# Patient Record
Sex: Male | Born: 1950 | Race: White | Hispanic: No | Marital: Married | Smoking: Never smoker
Health system: Southern US, Community
[De-identification: ages and names within clinical notes are randomized; demographics above are authoritative.]

## PROBLEM LIST (undated history)

## (undated) DIAGNOSIS — E785 Hyperlipidemia, unspecified: Secondary | ICD-10-CM

## (undated) DIAGNOSIS — R011 Cardiac murmur, unspecified: Secondary | ICD-10-CM

## (undated) DIAGNOSIS — T7840XA Allergy, unspecified, initial encounter: Secondary | ICD-10-CM

## (undated) HISTORY — DX: Cardiac murmur, unspecified: R01.1

## (undated) HISTORY — DX: Allergy, unspecified, initial encounter: T78.40XA

## (undated) HISTORY — PX: HAND SURGERY: SHX662

## (undated) HISTORY — PX: SPINE SURGERY: SHX786

## (undated) HISTORY — PX: COLONOSCOPY: SHX174

## (undated) HISTORY — DX: Hyperlipidemia, unspecified: E78.5

---

## 2001-08-26 ENCOUNTER — Ambulatory Visit (HOSPITAL_COMMUNITY): Admission: RE | Admit: 2001-08-26 | Discharge: 2001-08-26 | Payer: Self-pay | Admitting: Internal Medicine

## 2001-08-26 ENCOUNTER — Encounter: Payer: Self-pay | Admitting: Internal Medicine

## 2003-06-13 ENCOUNTER — Encounter: Admission: RE | Admit: 2003-06-13 | Discharge: 2003-09-11 | Payer: Self-pay | Admitting: Neurology

## 2003-09-25 ENCOUNTER — Emergency Department (HOSPITAL_COMMUNITY): Admission: AD | Admit: 2003-09-25 | Discharge: 2003-09-25 | Payer: Self-pay | Admitting: Family Medicine

## 2005-06-21 ENCOUNTER — Emergency Department (HOSPITAL_COMMUNITY): Admission: EM | Admit: 2005-06-21 | Discharge: 2005-06-21 | Payer: Self-pay | Admitting: Emergency Medicine

## 2007-07-12 ENCOUNTER — Emergency Department (HOSPITAL_COMMUNITY): Admission: EM | Admit: 2007-07-12 | Discharge: 2007-07-12 | Payer: Self-pay | Admitting: Emergency Medicine

## 2008-11-23 ENCOUNTER — Ambulatory Visit (HOSPITAL_BASED_OUTPATIENT_CLINIC_OR_DEPARTMENT_OTHER): Admission: RE | Admit: 2008-11-23 | Discharge: 2008-11-23 | Payer: Self-pay | Admitting: Orthopedic Surgery

## 2008-11-23 ENCOUNTER — Encounter (INDEPENDENT_AMBULATORY_CARE_PROVIDER_SITE_OTHER): Payer: Self-pay | Admitting: Orthopedic Surgery

## 2008-12-31 ENCOUNTER — Encounter (INDEPENDENT_AMBULATORY_CARE_PROVIDER_SITE_OTHER): Payer: Self-pay | Admitting: *Deleted

## 2009-10-17 ENCOUNTER — Telehealth: Payer: Self-pay | Admitting: Gastroenterology

## 2010-09-09 ENCOUNTER — Encounter: Payer: Self-pay | Admitting: Gastroenterology

## 2010-09-09 ENCOUNTER — Ambulatory Visit (HOSPITAL_COMMUNITY)
Admission: RE | Admit: 2010-09-09 | Discharge: 2010-09-09 | Payer: Self-pay | Source: Home / Self Care | Attending: Internal Medicine | Admitting: Internal Medicine

## 2010-09-16 NOTE — Progress Notes (Signed)
Summary: Schedule Colonoscopy  Phone Note Outgoing Call Call back at [facility] Phone [phone] Call back at [phone]   Call placed by: Harlow Mares CMA Duncan Dull),  October 17, 2009 11:38 AM Call placed to: Patient Summary of Call: patient phone number just rings then goes to fax number. Schedule colonoscopy Initial call taken by: Harlow Mares CMA Duncan Dull),  October 17, 2009 11:39 AM  Follow-up for Phone Call        Left message on patients machine to call back.  Follow-up by: Harlow Mares CMA Duncan Dull),  October 24, 2009 3:45 PM

## 2010-09-18 NOTE — Letter (Signed)
Summary: Pre Visit Letter Revised  [facility]  [address] [place], [place] [address]   Phone: [phone]  Fax: [phone]        09/09/2010 MRN: [id number]  [facility] [address] [place], [place]  [address]  [place]             Procedure Date:  10-13-10 10:30am           Recall Colon - Dr Nedra Hai to the Gastroenterology Division at [facility].    You are scheduled to see a nurse for your pre-procedure visit on 09-30-10 at 9am on the 3rd floor at [facility], [address]  We ask that you try to arrive at our office 15 minutes prior to your appointment time to allow for check-in.  Please take a minute to review the attached form.  If you answer "Yes" to one or more of the questions on the first page, we ask that you call the person listed at your earliest opportunity.  If you answer "No" to all of the questions, please complete the rest of the form and bring it to your appointment.    Your nurse visit will consist of discussing your medical and surgical history, your immediate family medical history, and your medications.   If you are unable to list all of your medications on the form, please bring the medication bottles to your appointment and we will list them.  We will need to be aware of both prescribed and over the counter drugs.  We will need to know exact dosage information as well.    Please be prepared to read and sign documents such as consent forms, a financial agreement, and acknowledgement forms.  If necessary, and with your consent, a friend or relative is welcome to sit-in on the nurse visit with you.  Please bring your insurance card so that we may make a copy of it.  If your insurance requires a referral to see a specialist, please bring your referral form from your primary care physician.  No co-pay is required for this nurse visit.     If you cannot keep your appointment, please call [phone] to cancel or  reschedule prior to your appointment date.  This allows [place] the opportunity to schedule an appointment for another patient in need of care.    Thank you for choosing [facility] for your medical needs.  We appreciate the opportunity to care for you.  Please visit [place] at our website  to learn more about our practice.  Sincerely, The Gastroenterology Division

## 2010-09-26 ENCOUNTER — Encounter (INDEPENDENT_AMBULATORY_CARE_PROVIDER_SITE_OTHER): Payer: Self-pay

## 2010-09-30 ENCOUNTER — Encounter: Payer: Self-pay | Admitting: Gastroenterology

## 2010-10-08 NOTE — Miscellaneous (Signed)
Summary: Lec previsit  Clinical Lists Changes  Medications: Added new medication of MOVIPREP 100 GM  SOLR (PEG-KCL-NACL-NASULF-NA ASC-C) As per prep instructions. - Signed Rx of MOVIPREP 100 GM  SOLR (PEG-KCL-NACL-NASULF-NA ASC-C) As per prep instructions.;  #1 x 0;  Signed;  Entered by: Ulis Rias RN;  Authorized by: Louis Meckel MD;  Method used: Electronically to [facility] [address], [place], [place]  [id number], Ph: [phone] or [phone], Fax: [phone] Allergies: Added new allergy or adverse reaction of PENICILLIN Added new allergy or adverse reaction of ASPIRIN Observations: Added new observation of NKA: F (09/30/2010 9:05)    Prescriptions: MOVIPREP 100 GM  SOLR (PEG-KCL-NACL-NASULF-NA ASC-C) As per prep instructions.  #1 x 0   Entered by:   Ulis Rias RN   Authorized by:   Louis Meckel MD   Signed by:   Ulis Rias RN on 09/30/2010   Method used:   Electronically to        [facility] (retail)       [address]       [place], [place]  [id number]       Ph: [phone] or [phone]       Fax: [phone]   RxID:   [id number]

## 2010-10-08 NOTE — Letter (Signed)
Summary: [facility] Instructions  [facility]  [address] [place], [place] [address]   Phone: [phone]  Fax: [phone]       Randy Reese    Dec 31, 1958    MRN: [id number]        Procedure Day /Date:  Monday 10/13/2010     Arrival Time: 9:30 am     Procedure Time: 10:30 am     Location of Procedure:                    _x _  [facility] (4th Floor)                        PREPARATION FOR COLONOSCOPY WITH MOVIPREP   Starting 5 days prior to your procedure Wednesday 2/22 do not eat nuts, seeds, popcorn, corn, beans, peas,  salads, or any raw vegetables.  Do not take any fiber supplements (e.g. Metamucil, Citrucel, and Benefiber).  THE DAY BEFORE YOUR PROCEDURE         DATE: Sunday 2/26  1.  Drink clear liquids the entire day-NO SOLID FOOD  2.  Do not drink anything colored red or purple.  Avoid juices with pulp.  No orange juice.  3.  Drink at least 64 oz. (8 glasses) of fluid/clear liquids during the day to prevent dehydration and help the prep work efficiently.  CLEAR LIQUIDS INCLUDE: Water Jello Ice Popsicles Tea (sugar ok, no milk/cream) Powdered fruit flavored drinks Coffee (sugar ok, no milk/cream) Gatorade Juice: apple, white grape, white cranberry  Lemonade Clear bullion, consomm, broth Carbonated beverages (any kind) Strained chicken noodle soup Hard Candy                             4.  In the morning, mix first dose of MoviPrep solution:    Empty 1 Pouch A and 1 Pouch B into the disposable container    Add lukewarm drinking water to the top line of the container. Mix to dissolve    Refrigerate (mixed solution should be used within 24 hrs)  5.  Begin drinking the prep at 5:00 p.m. The MoviPrep container is divided by 4 marks.   Every 15 minutes drink the solution down to the next mark (approximately 8 oz) until the full liter is complete.   6.  Follow completed prep with 16 oz of clear liquid of your choice (Nothing  red or purple).  Continue to drink clear liquids until bedtime.  7.  Before going to bed, mix second dose of MoviPrep solution:    Empty 1 Pouch A and 1 Pouch B into the disposable container    Add lukewarm drinking water to the top line of the container. Mix to dissolve    Refrigerate  THE DAY OF YOUR PROCEDURE      DATE: Monday 2/27  Beginning at 5:30 a.m. (5 hours before procedure):         1. Every 15 minutes, drink the solution down to the next mark (approx 8 oz) until the full liter is complete.  2. Follow completed prep with 16 oz. of clear liquid of your choice.    3. You may drink clear liquids until 8:30 am (2 HOURS BEFORE PROCEDURE).   MEDICATION INSTRUCTIONS  Unless otherwise instructed, you should take regular prescription medications with a small sip of water   as early as possible the morning of your  procedure.         OTHER INSTRUCTIONS  You will need a responsible adult at least 58 years of age to accompany you and drive you home.   This person must remain in the waiting room during your procedure.  Wear loose fitting clothing that is easily removed.  Leave jewelry and other valuables at home.  However, you may wish to bring a book to read or  an iPod/MP3 player to listen to music as you wait for your procedure to start.  Remove all body piercing jewelry and leave at home.  Total time from sign-in until discharge is approximately 2-3 hours.  You should go home directly after your procedure and rest.  You can resume normal activities the  day after your procedure.  The day of your procedure you should not:   Drive   Make legal decisions   Operate machinery   Drink alcohol   Return to work  You will receive specific instructions about eating, activities and medications before you leave.    The above instructions have been reviewed and explained to me by   Ulis Rias RN  September 30, 2010 9:46 AM     I fully understand and can  verbalize these instructions _____________________________ Date _________

## 2010-10-13 ENCOUNTER — Encounter: Payer: Self-pay | Admitting: Gastroenterology

## 2010-10-13 ENCOUNTER — Other Ambulatory Visit (AMBULATORY_SURGERY_CENTER): Payer: BC Managed Care – PPO | Admitting: Gastroenterology

## 2010-10-13 DIAGNOSIS — Z1211 Encounter for screening for malignant neoplasm of colon: Secondary | ICD-10-CM

## 2010-10-23 NOTE — Procedures (Signed)
Summary: Colonoscopy  Patient: Randy Reese Note: All result statuses are Final unless otherwise noted.  Tests: (1) Colonoscopy (COL)   COL Colonoscopy           DONE     [facility]     [address]     [place], [place]  [address]           COLONOSCOPY PROCEDURE REPORT           PATIENT:  Randy Reese, Randy Reese  MR#:  [id number]     BIRTHDATE:  Jul 29, 1951, 59 yrs. old  GENDER:  male           ENDOSCOPIST:  Barbette Hair. Arlyce Dice, MD     Referred by:  Nila Nephew, M.D.           PROCEDURE DATE:  10/13/2010     PROCEDURE:  Diagnostic Colonoscopy     ASA CLASS:  Class I     INDICATIONS:  1) Routine Risk Screening           MEDICATIONS:   Fentanyl 50 mcg IV, Versed 7 mg IV           DESCRIPTION OF PROCEDURE:   After the risks benefits and     alternatives of the procedure were thoroughly explained, informed     consent was obtained.  Digital rectal exam was performed and     revealed no abnormalities.   The LB CF-[id number] endoscope     was introduced through the anus and advanced to the cecum, which     was identified by both the appendix and ileocecal valve, without     limitations.  The quality of the prep was excellent, using     MoviPrep.  The instrument was then slowly withdrawn as the colon     was fully examined.     <<PROCEDUREIMAGES>>           FINDINGS:  A normal appearing cecum, ileocecal valve, and     appendiceal orifice were identified. The ascending, hepatic     flexure, transverse, splenic flexure, descending, sigmoid colon,     and rectum appeared unremarkable (see image1, image3, image4,     image6, image8, image13, image14, image16, and image17).     Retroflexed views in the rectum revealed no abnormalities.    The     time to cecum =  3.50  minutes. The scope was then withdrawn (time     =  6.75  min) from the patient and the procedure completed.           COMPLICATIONS:  None           ENDOSCOPIC IMPRESSION:     1) Normal colon     RECOMMENDATIONS:     1) Continue current colorectal screening recommendations for     "routine risk" patients with a repeat colonoscopy in 10 years.           REPEAT EXAM:   10 year(s) Colonoscopy           ______________________________     Barbette Hair. Arlyce Dice, MD           CC:           n.     eSIGNED:   Barbette Hair. Kaplan at 10/13/2010 11:35 AM           Randy Reese, [id number]  Note: An exclamation mark (!) indicates a result that was not dispersed into the flowsheet. Document Creation  Date: 10/13/2010 11:35 AM _______________________________________________________________________  (1) Order result status: Final Collection or observation date-time: 10/13/2010 11:29 Requested date-time:  Receipt date-time:  Reported date-time:  Referring Physician:   Ordering Physician: Melvia Heaps [id number]) Specimen Source:  Source: Launa Grill Order Number: [id number] Lab site:   Appended Document: Colonoscopy    Clinical Lists Changes  Observations: Added new observation of COLONNXTDUE: 09/2020 (10/13/2010 12:08)

## 2010-11-26 LAB — ANAEROBIC CULTURE: Gram Stain: NONE SEEN

## 2010-11-26 LAB — TISSUE CULTURE
Culture: NO GROWTH
Gram Stain: NONE SEEN

## 2010-12-30 NOTE — Op Note (Signed)
[name], Randy Reese                 ACCOUNT NO.:  [id number]   MEDICAL RECORD NO.:  [id number]          PATIENT TYPE:  AMB   LOCATION:  DSC                          FACILITY:  [facility]   PHYSICIAN:  Katy Fitch. Sypher, M.D. 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  Final diagnosis is  pending results of culture, polarized light exam, and routine pathology.   OPERATION:  Excisional biopsy of inflammatory mass, dorsal aspect of  right thumb distal proximal phalanx and interphalangeal joint.   OPERATING SURGEON:  Katy Fitch. Sypher, MD   ASSISTANT:  None.   ANESTHESIA:  Lidocaine 2% metacarpal head level block, right thumb.   ANESTHETIST:  Katy Fitch. Sypher, M.D.   INDICATIONS:  Randy Reese.  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   He initially presented to Randy Reese, who made a diagnosis of  possible cellulitis.  Dr. Madelon Lips placed Randy Reese on a prescription of  Septra DS 1 p.o. b.i.d. x7 days.   The mass did not change.  Therefore, a Hand Surgery consult was  requested.   Plain films at Dr. Candise Bowens office were normal except for soft-tissue  swelling.  No calcification was noted.  No foreign body was visible.   Randy Reese was seen for evaluation from a hand surgery perspective.  He  appeared to have an inflammatory mass with no history of trauma or  foreign body.  The films at Dr. Candise Bowens office were reviewed.  A  diagnosis was uncertain.  I placed on a second prescription of  doxycycline 100 mg p.o. b.i.d. x7 days.  This did not affect the mass  nor the degree of inflammation.  Therefore, we recommended excisional  biopsy under local anesthesia in an effort to discern whether this was  infectious, inflammatory foreign body or perhaps a crystal arthropathy  manifestation.   After informed consent, Mr. Randy Reese was brought to the operating at this  time.   By his request, the procedure was performed under straight local  anesthesia in a minor operating room setting.  However, due to the  proximity to his tendon and joint, I advised to perform it in a standard  operating room.   Preoperative questions were invited and answered in detail.   PROCEDURE:  Randy Reese was brought to the operating room and placed  in the supine position upon the operating table.   Following Betadine prep, a 2% lidocaine block was placed at metacarpal  head level to obtain a digital block.   Randy Reese right arm and hand were prepped with Betadine soap and  solution and sterilely draped.  After a  time-out, Randy Reese thumb was  exsanguinated by direct compression and a quarter-inch Penrose drain was  used as a digital tourniquet at the base of the thumb overlying the  metacarpophalangeal joint.   The procedure commenced with a lazy-S incision directly over the mass.  Subcutaneous tissues were carefully divided revealing a very edematous  inflammatory mass that was matted and adherent to the dermis.  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  Given the fact that  there was no radiolucent material noted on plain film, uric acid  crystals would be high in my listed differential diagnosis.   With great care, the mass was circumferentially dissected from the  dermis, extensor tendon, and the dorsal veins.  The mass was removed,   divided into segments, one half into formalin for histopathologic  evaluation, one half into absolute alcohol for crystal evaluation and  then swabs for aerobic and anaerobic growth as well as rongeur  curettage.  Specimens for aerobic and anaerobic growth were sent.   The wound was then inspected for bleeding points followed by repair of  the skin with a loose horizontal mattress sutures.  Mr. Randy Reese was placed  in compressive dressing with sterile gauze and Coban.  He was advised to  elevate his thumb for 48 hours.  He will return for followup on Monday,  November 26, 2008.  He was provided prescriptions for Percocet 5 mg 1/2 to  1 tablet p.o. q.4-6 h. p.r.n. pain, 20 tablets without refill.  Also,  doxycycline 100 mg p.o. b.i.d. x5 days as a prophylactic antibiotic.   Our diagnosis at this time is uncertain, pending the results of our lab  studies.  He is advised to contact us p.r.n. problems with the  medication or other unexpected outcome.       Katy Fitch Sypher, M.D.  Electronically Signed     RVS/MEDQ  D:  11/23/2008  T:  11/24/2008  Job:  [id number]

## 2011-05-26 LAB — CBC
Hemoglobin: 13.3
MCHC: 33.3
MCV: 79.8
RBC: 4.99

## 2011-05-26 LAB — URINALYSIS, ROUTINE W REFLEX MICROSCOPIC
Bilirubin Urine: NEGATIVE
Hgb urine dipstick: NEGATIVE
Ketones, ur: 15 — AB
Specific Gravity, Urine: 1.02
Urobilinogen, UA: 0.2

## 2011-05-26 LAB — DIFFERENTIAL
Lymphocytes Relative: 6 — ABNORMAL LOW
Lymphs Abs: 0.8
Neutrophils Relative %: 90 — ABNORMAL HIGH

## 2011-05-26 LAB — COMPREHENSIVE METABOLIC PANEL
CO2: 29
Calcium: 9.9
Creatinine, Ser: 0.96
GFR calc Af Amer: >60
GFR calc non Af Amer: >60
Glucose, Bld: 118 — ABNORMAL HIGH

## 2012-08-15 ENCOUNTER — Other Ambulatory Visit: Payer: Self-pay | Admitting: Dermatology

## 2013-05-07 ENCOUNTER — Ambulatory Visit: Payer: BC Managed Care – PPO | Admitting: Family Medicine

## 2013-05-07 ENCOUNTER — Ambulatory Visit: Payer: BC Managed Care – PPO

## 2013-05-07 VITALS — BP 122/74 | HR 80 | Temp 98.4°F | Resp 17 | Ht 71.0 in | Wt 161.0 lb

## 2013-05-07 DIAGNOSIS — M25462 Effusion, left knee: Secondary | ICD-10-CM

## 2013-05-07 DIAGNOSIS — M25469 Effusion, unspecified knee: Secondary | ICD-10-CM

## 2013-05-07 DIAGNOSIS — M25562 Pain in left knee: Secondary | ICD-10-CM

## 2013-05-07 DIAGNOSIS — M25569 Pain in unspecified knee: Secondary | ICD-10-CM

## 2013-05-07 NOTE — Progress Notes (Signed)
[address]   [place], [place]  [address]   [phone]  Subjective:    Patient ID: Randy Reese, male    DOB: Jun 21, 1951, 62 y.o.   MRN: [id number]  HPI This 62 y.o. male presents for evaluation of L knee swelling.  +Intermittent catching pain for some time now.  For last week, developed more persistent pain.  Walking was fine despite worsening persistent pain over the past week.  Pain has worsened over the weekend with development of knee swelling; +prolonged standing within past few days with development of swelling.   Now, +pain with weight bearing.  No injury in past or in recent days.  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  Plans to see orthopedist this week for knee but wanted to be evaluated to see if anything immediate needed to be done.  No history of gout.  Review of Systems  Constitutional: Negative for fever, chills, diaphoresis and fatigue.  Musculoskeletal: Positive for arthralgias, gait problem and joint swelling.  Skin: Negative for color change, pallor, rash and wound.  Neurological: Negative for weakness and numbness.   History reviewed. No pertinent past medical history. History reviewed. No pertinent past surgical history. Allergies  Allergen Reactions  . Aspirin     REACTION: hives  . Penicillins     REACTION: hives   No current outpatient prescriptions on file prior to visit.   No current facility-administered medications on file prior to visit.   History   Social History  . Marital Status: Married    Spouse Name: N/A    Number of Children: N/A  . Years of Education: N/A   Occupational History  . Not on file.   Social History Main Topics  . Smoking status: Never Smoker   . Smokeless tobacco: Not on file  . Alcohol Use: Not on file  . Drug Use: Not on file  . Sexual Activity: Not on file   Other Topics Concern  . Not on file   Social History Narrative  . No narrative on file   No  family history on file.     Objective:   Physical Exam  Nursing note and vitals reviewed. Cardiovascular: Intact distal pulses.   Pulses:      Dorsalis pedis pulses are 2+ on the right side, and 2+ on the left side.  Musculoskeletal:       Left knee: He exhibits swelling and effusion. He exhibits normal range of motion, no ecchymosis, no deformity, no laceration, no erythema, normal alignment, no LCL laxity, normal patellar mobility, no bony tenderness and normal meniscus. Tenderness found. Medial joint line and lateral joint line tenderness noted. No MCL, no LCL and no patellar tendon tenderness noted.  L KNEE: MODERATE JOINT EFFUSION DIFFUSELY; UNABLE TO FULLY EXTEND KNEE DUE TO EFFUSION.  SLIGHT LIMP TO GAIT. ANTERIOR DRAWER SIGN NEGATIVE; LACHMAN'S NEGATIVE; V/V STRAIN INTACT.     [facility] reading (PRIMARY) by  Dr. Katrinka Blazing.  L KNEE:  NAD.  NO ARTHRITIC CHANGES.   Assessment & Plan:  Left knee pain - Plan: DG Knee 1-2 Views Left, Ambulatory referral to Orthopedic Surgery  Swelling of knee joint, left - Plan: Ambulatory referral to Orthopedic Surgery   1.  L Knee pain:  New.  Atraumatic.  Secondary to swelling and pathology.  Recommend Tylenol PRN pain. 2.  L knee swelling: New. Atraumatic in nature.  Discussed treatment options at length including aspiration of fluid today; patient desires  to defer aspiration of joint by ortho in upcoming week.  Ambulating moderately well despite effusion of knee joint; pain tolerate at this time; referral to ortho.  RTC immediately for development of fever/chill/sweats/systemic illness.  Recommend ice, elevation, rest.  No orders of the defined types were placed in this encounter.   Nilda Simmer, M.D.  [facility] [address] [place], [place]  [address] [phone] phone [phone] fax

## 2013-05-07 NOTE — Patient Instructions (Addendum)
1.  ICE KNEE TWICE DAILY FOR 15-20 MINUTES. 2.  ELEVATE KNEE WHEN AT REST. 3.  YOU WILL BE CONTACTED THIS WEEK WITH APPOINTMENT WITH ORTHOPEDIST.  4.  VERY IMPORTANT TO UNDERGO EVALUATION BY ORTHOPEDIST THIS WEEK. 5. TAKE ALEVE TWO TABLETS TWICE DAILY WITH FOOD.

## 2013-06-22 ENCOUNTER — Other Ambulatory Visit: Payer: Self-pay

## 2013-08-31 ENCOUNTER — Other Ambulatory Visit: Payer: Self-pay | Admitting: Oncology

## 2016-02-10 ENCOUNTER — Other Ambulatory Visit: Payer: Self-pay | Admitting: Oncology

## 2016-02-26 ENCOUNTER — Other Ambulatory Visit: Payer: Self-pay | Admitting: Dermatology

## 2016-08-16 ENCOUNTER — Ambulatory Visit (INDEPENDENT_AMBULATORY_CARE_PROVIDER_SITE_OTHER): Payer: BLUE CROSS/BLUE SHIELD

## 2016-08-16 ENCOUNTER — Ambulatory Visit (HOSPITAL_COMMUNITY)
Admission: EM | Admit: 2016-08-16 | Discharge: 2016-08-16 | Disposition: A | Discharge status: Home / Self Care | Payer: BLUE CROSS/BLUE SHIELD | Attending: Emergency Medicine | Admitting: Emergency Medicine

## 2016-08-16 ENCOUNTER — Encounter (HOSPITAL_COMMUNITY): Payer: Self-pay | Admitting: *Deleted

## 2016-08-16 DIAGNOSIS — B9789 Other viral agents as the cause of diseases classified elsewhere: Secondary | ICD-10-CM | POA: Diagnosis not present

## 2016-08-16 DIAGNOSIS — J069 Acute upper respiratory infection, unspecified: Secondary | ICD-10-CM

## 2016-08-16 MED ORDER — HYDROCODONE-HOMATROPINE 5-1.5 MG/5ML PO SYRP: 5.0000 mL | ORAL_SOLUTION | Freq: Four times a day (QID) | ORAL | 0 refills | Status: DC | PRN

## 2016-08-16 NOTE — ED Triage Notes (Signed)
Pt  Reports   sev  Days  of  Fever    Cough      Malaise    Stuffyness       Nasal     Congestion     X   3  Days

## 2016-08-16 NOTE — Discharge Instructions (Signed)
Saline irrigation or a neti pot will help with sinus congestion.  A neti pot is a container designed to rinse debris or mucus from your nasal cavity. You might use a neti pot to treat symptoms of nasal allergies, sinus problems or colds. If you choose to make your own saltwater solution, it's important to use bottled water that has been distilled or sterilized. Tap water is acceptable if it's been boiled for several minutes and then left to cool until it is lukewarm. To use the neti pot, tilt your head sideways over the sink and place the spout of the neti pot in the upper nostril. Breathing through your open mouth, gently pour the saltwater solution into your upper nostril so that the liquid drains through the lower nostril. Repeat on the other side. Be sure to rinse the irrigation device after each use with similarly distilled, sterile, previously boiled and cooled, or filtered water and leave open to air dry. Neti pots are often available in pharmacies and health food stores, as well online.

## 2016-08-16 NOTE — ED Provider Notes (Signed)
CSN: [id number]     Arrival date & time 08/16/16  1304 History   First MD Initiated Contact with Patient 08/16/16 1526     Chief Complaint  Patient presents with  . Cough   (Consider location/radiation/quality/duration/timing/severity/associated sxs/prior Treatment)  HPI   Patient is a 65 year old male presenting today with complaints of cough and congestion times the past 2-3 days. Patient states his primary care provider is Ed Chilton SiGreen and asked him to come here today to have a chest x-ray. Patient reports a fever of 101.4 yesterday.  The patient states he has seasonal allergies and congestion, but his last serious cold was approximately last year. Patient denies a history of asthma or issues with wheezing. Denies nausea, vomiting, or diarrhea. States he does have a headache with sinus congestion and stuffiness. Reports a productive cough in the mornings when he wakes up.  History reviewed. No pertinent past medical history. History reviewed. No pertinent surgical history. History reviewed. No pertinent family history. Social History  Substance Use Topics  . Smoking status: Never Smoker  . Smokeless tobacco: Never Used  . Alcohol use Yes    Review of Systems  Constitutional: Positive for chills, fatigue and fever.  HENT: Positive for congestion, postnasal drip and sinus pressure. Negative for sore throat and trouble swallowing.   Eyes: Negative.   Respiratory: Positive for cough. Negative for chest tightness, shortness of breath and wheezing.   Cardiovascular: Negative.  Negative for chest pain and leg swelling.  Gastrointestinal: Negative.  Negative for abdominal pain, diarrhea, nausea and vomiting.  Endocrine: Negative.   Genitourinary: Negative.   Musculoskeletal: Negative.  Negative for myalgias, neck pain and neck stiffness.       Denies generalized muscle aches, but reports some malaise.   Skin: Negative.  Negative for rash.  Allergic/Immunologic: Positive for environmental  allergies.  Neurological: Negative.  Negative for headaches.  Hematological: Negative.   Psychiatric/Behavioral: Negative.     Allergies  Erythromycin; Aspirin; and Penicillins  Home Medications   Prior to Admission medications   Medication Sig Start Date End Date Taking? Authorizing Provider  HYDROcodone-homatropine (HYCODAN) 5-1.5 MG/5ML syrup Take 5 mLs by mouth every 6 (six) hours as needed for cough. 08/16/16   Servando Salinaatherine H Brookelle Pellicane, NP   Meds Ordered and Administered this Visit  Medications - No data to display  BP 140/70 (BP Location: Right Arm)   Pulse 80   Temp 98.6 F (37 C) (Oral)   Resp 18   SpO2 100%  No data found.   Physical Exam  Constitutional: He appears well-developed and well-nourished. No distress.  HENT:  Head: Normocephalic and atraumatic.  Right Ear: External ear normal.  Left Ear: External ear normal.  Mouth/Throat: No oropharyngeal exudate.  Bilateral tympanic membranes pearly gray in appearance with light reflexes present and bony prominences visualized. Clear fluid present behind left tympanic membrane. Some distortion of light reflex. Bilateral nares are patent with nasal discharge present. Posterior oropharynx is moderately red with mucoid discharge noted. No evidence of exudate or patches.   Neck: Normal range of motion. Neck supple.  Cardiovascular: Normal rate, regular rhythm, normal heart sounds and intact distal pulses.  Exam reveals no gallop and no friction rub.   No murmur heard. Pulmonary/Chest: Effort normal and breath sounds normal. No respiratory distress. He has no wheezes. He has no rales. He exhibits no tenderness.  Lymphadenopathy:    He has no cervical adenopathy.  Neurological: He is alert.  Skin: Skin is warm and dry. He  is not diaphoretic.  Nursing note and vitals reviewed.   Urgent Care Course   Clinical Course     Procedures (including critical care time)  Labs Review Labs Reviewed - No data to display  Imaging  Review Dg Chest 2 View  Result Date: 08/16/2016 CLINICAL DATA:  Sinus congestion, cough and fever for the past 3 days. EXAM: CHEST  2 VIEW COMPARISON:  None. FINDINGS: Normal cardiac silhouette and mediastinal contours. Multiple punctate granuloma are seen bilaterally. Presumed dermal calcification overlies the right inferolateral chest. No focal airspace opacities. No pleural effusion or pneumothorax. No evidence of edema. No acute osseus abnormalities. IMPRESSION: 1. No acute cardiopulmonary disease. Specifically, no evidence of pneumonia. 2. Sequelae of prior granulomatous infection as above. Electronically Signed   By: Simonne ComeJohn  Watts M.D.   On: 08/16/2016 15:30      MDM   1. Viral URI with cough    The usual and customary discharge instructions and warnings were given.  The patient verbalizes understanding and agrees to plan of care.      Servando Salinaatherine H Sedrick Tober, NP 08/16/16 1558

## 2017-03-29 IMAGING — DX DG CHEST 2V
2 series · 2 of 2 positions shown · non-contrast
Comparison: None.

CLINICAL DATA: Sinus congestion, cough and fever for the past 3
days.

EXAM:
CHEST  2 VIEW

[chest pa]
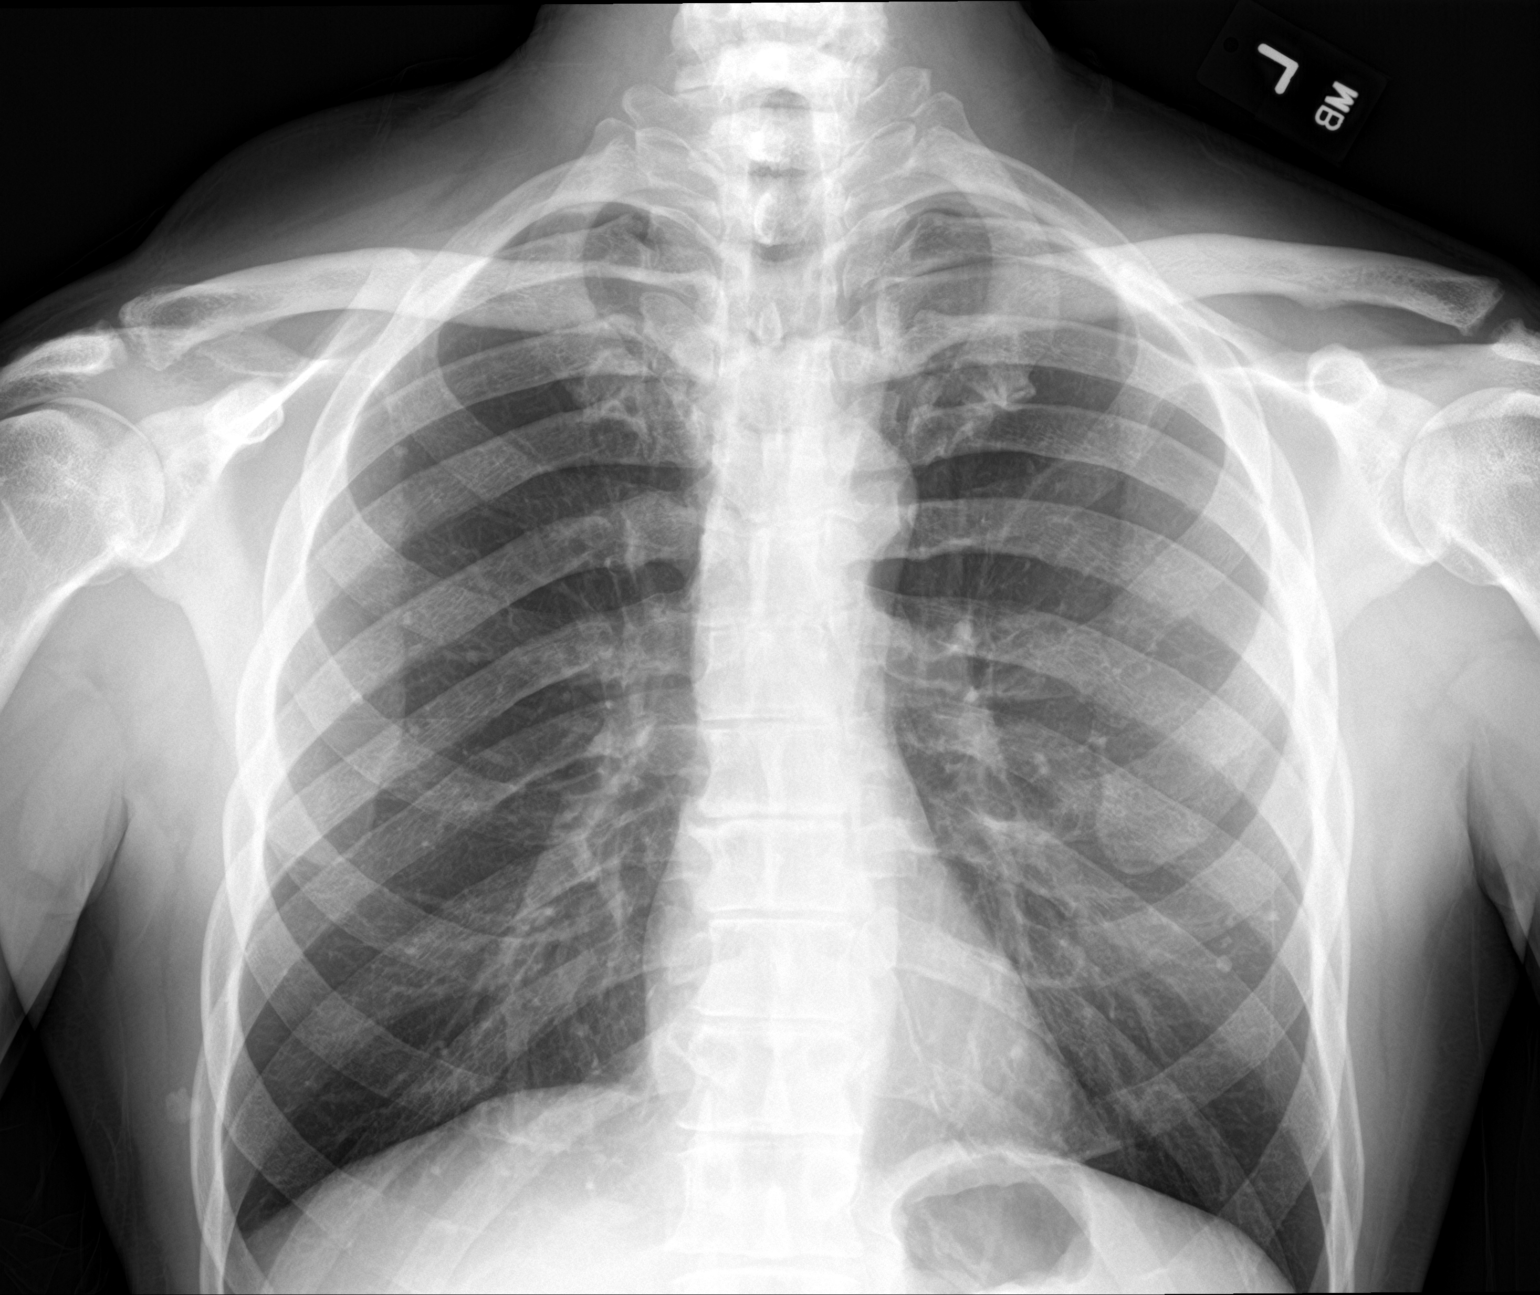

[chest lat]
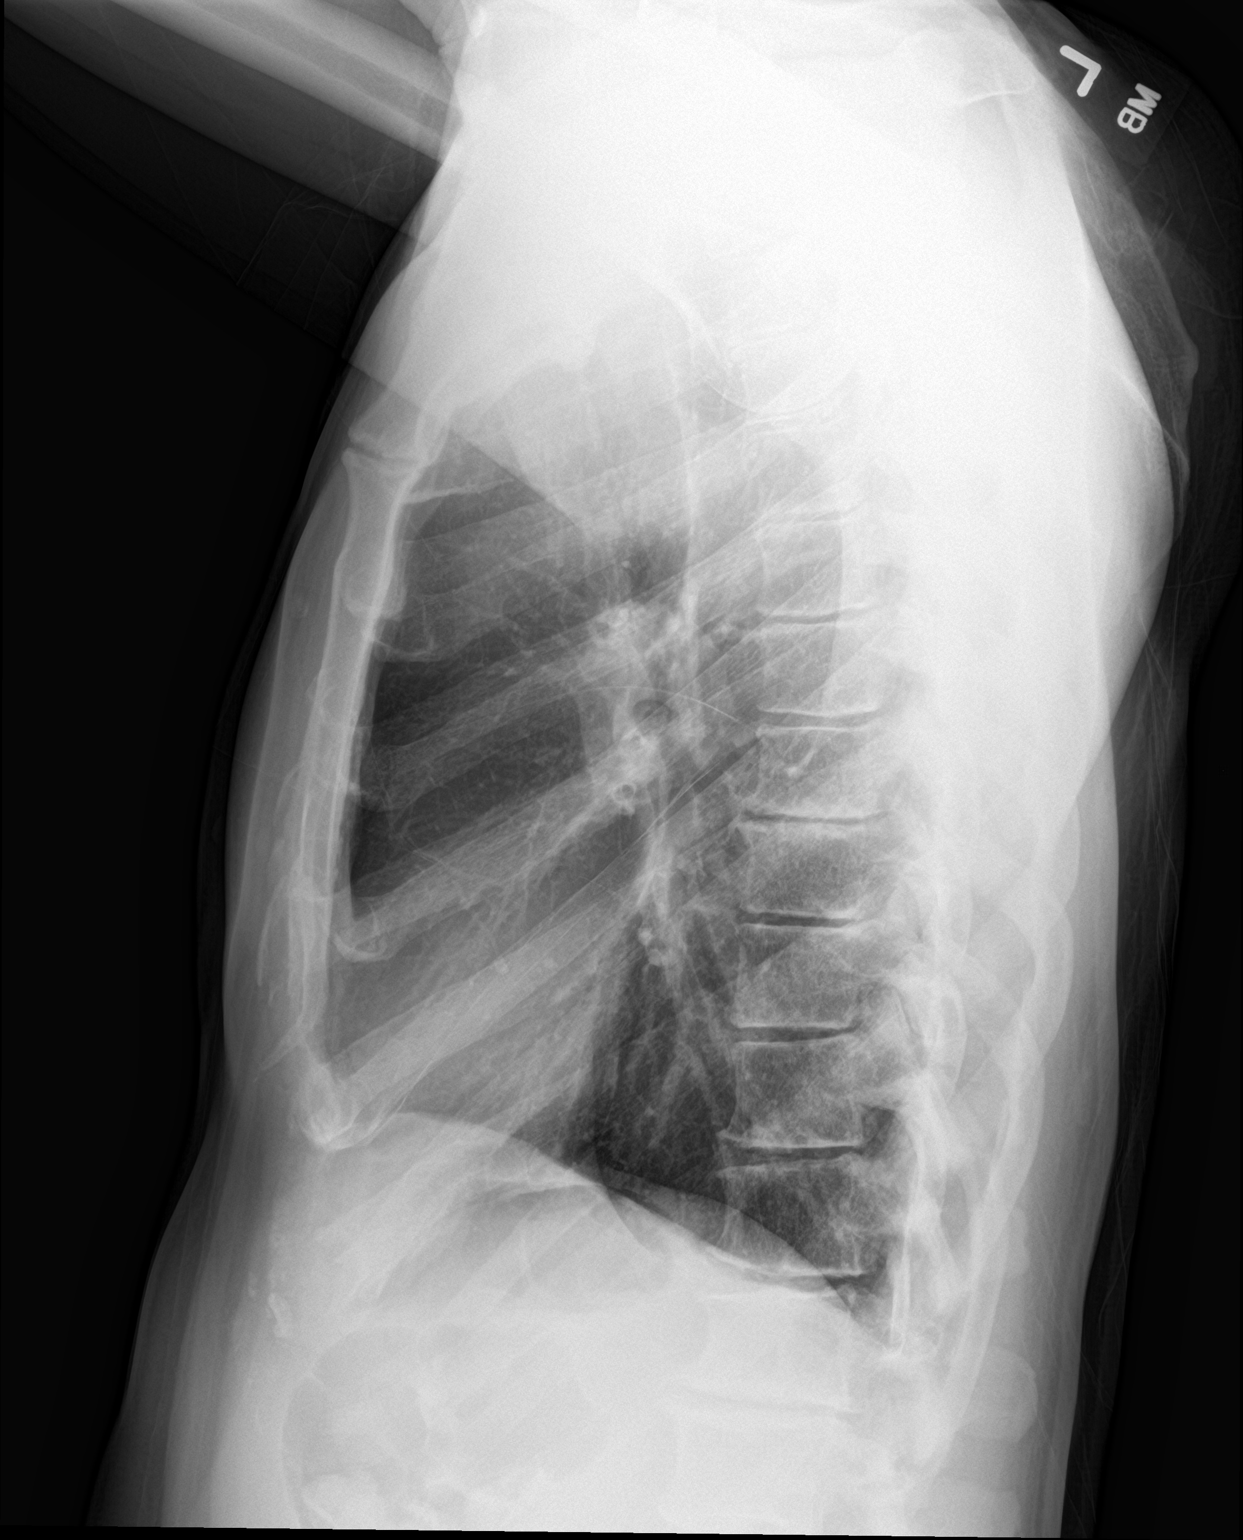

[2 of 2 positions shown; findings below may reference images not displayed]

FINDINGS: Normal cardiac silhouette and mediastinal contours. Multiple
punctate granuloma are seen bilaterally. Presumed dermal
calcification overlies the right inferolateral chest. No focal
airspace opacities. No pleural effusion or pneumothorax. No evidence
of edema. No acute osseus abnormalities.
IMPRESSION: 1. No acute cardiopulmonary disease. Specifically, no evidence of
pneumonia.
2. Sequelae of prior granulomatous infection as above.

## 2017-05-08 ENCOUNTER — Encounter: Payer: Self-pay | Admitting: Family Medicine

## 2017-05-08 ENCOUNTER — Ambulatory Visit (INDEPENDENT_AMBULATORY_CARE_PROVIDER_SITE_OTHER): Payer: BLUE CROSS/BLUE SHIELD | Admitting: Family Medicine

## 2017-05-08 VITALS — BP 120/66 | HR 63 | Temp 97.9°F | Resp 17 | Ht 71.0 in | Wt 155.2 lb

## 2017-05-08 DIAGNOSIS — Z23 Encounter for immunization: Secondary | ICD-10-CM

## 2017-05-08 DIAGNOSIS — H44002 Unspecified purulent endophthalmitis, left eye: Secondary | ICD-10-CM

## 2017-05-08 MED ORDER — TOBRAMYCIN-DEXAMETHASONE 0.3-0.1 % OP SUSP: 1.0000 [drp] | OPHTHALMIC | 1 refills | Status: DC

## 2017-05-08 NOTE — Progress Notes (Signed)
  Chief Complaint  Patient presents with  . New Patient (Initial Visit)    left eye swollen since last evening, pain level 2/10 and tried old antibiotic gtts in eye with no relief    HPI  Pt with left upper eye lid that is swollen and started out of the blue last night  There is no itching or redness of the eye There is tenderness He had a similar episode about 3 years ago and was given tobramycin betamethasone drops He reports that he used his old eye drops without relief No environmental allergies No known triggers No vision changes He normally wears soft contacts but has removed them  Past Medical History:  Diagnosis Date  . Heart murmur     Current Outpatient Prescriptions  Medication Sig Dispense Refill  . pantoprazole (PROTONIX) 40 MG tablet Take 40 mg by mouth daily.    Marland Kitchen tobramycin-dexamethasone (TOBRADEX) ophthalmic solution Place 1 drop into the left eye every 4 (four) hours while awake. 5 mL 1   No current facility-administered medications for this visit.     Allergies:  Allergies  Allergen Reactions  . Erythromycin Other (See Comments)  . Aspirin     REACTION: hives  . Penicillins     REACTION: hives    Past Surgical History:  Procedure Laterality Date  . SPINE SURGERY      Social History   Social History  . Marital status: Married    Spouse name: N/A  . Number of children: N/A  . Years of education: N/A   Social History Main Topics  . Smoking status: Never Smoker  . Smokeless tobacco: Never Used  . Alcohol use 0.6 - 1.2 oz/week    1 - 2 Shots of liquor per week     Comment: socially  . Drug use: No  . Sexual activity: Not Asked   Other Topics Concern  . None   Social History Narrative  . None    ROS  Objective: Vitals:   05/08/17 1031  BP: 120/66  Pulse: 63  Resp: 17  Temp: 97.9 F (36.6 C)  TempSrc: Oral  SpO2: 97%  Weight: 155 lb 3.2 oz (70.4 kg)  Height:  (1.803 m)    Physical Exam  Constitutional: He appears  well-developed and well-nourished.  HENT:  Head: Normocephalic and atraumatic.  Mouth/Throat: Oropharynx is clear and moist.  Eyes: Pupils are equal, round, and reactive to light. Conjunctivae and EOM are normal. Right eye exhibits no chemosis, no discharge, no exudate and no hordeolum. No foreign body present in the right eye. Left eye exhibits discharge. Left eye exhibits no exudate and no hordeolum. No foreign body present in the left eye. No scleral icterus.    Assessment and Plan Javi was seen today for new patient (initial visit).  Diagnoses and all orders for this visit:  Need for prophylactic vaccination and inoculation against influenza -     Flu Vaccine QUAD 36+ mos IM  Eye infection, left  Will treat empirically Avoid contacts until treatment completed -     tobramycin-dexamethasone (TOBRADEX) ophthalmic solution; Place 1 drop into the left eye every 4 (four) hours while awake.     Augusto Deckman A Leontina Skidmore

## 2017-05-08 NOTE — Patient Instructions (Signed)
     IF you received an x-ray today, you will receive an invoice from [facility]. Please contact [facility] at [phone] with questions or concerns regarding your invoice.   IF you received labwork today, you will receive an invoice from [facility]. Please contact [facility] at [phone] with questions or concerns regarding your invoice.   Our billing staff will not be able to assist you with questions regarding bills from these companies.  You will be contacted with the lab results as soon as they are available. The fastest way to get your results is to activate your My Chart account. Instructions are located on the last page of this paperwork. If you have not heard from us regarding the results in 2 weeks, please contact this office.     

## 2018-04-12 DIAGNOSIS — H10502 Unspecified blepharoconjunctivitis, left eye: Secondary | ICD-10-CM | POA: Diagnosis not present

## 2018-06-10 DIAGNOSIS — J019 Acute sinusitis, unspecified: Secondary | ICD-10-CM | POA: Diagnosis not present

## 2018-06-10 DIAGNOSIS — Z23 Encounter for immunization: Secondary | ICD-10-CM | POA: Diagnosis not present

## 2018-06-10 DIAGNOSIS — R0902 Hypoxemia: Secondary | ICD-10-CM | POA: Diagnosis not present

## 2018-08-26 DIAGNOSIS — K210 Gastro-esophageal reflux disease with esophagitis: Secondary | ICD-10-CM | POA: Diagnosis not present

## 2018-08-26 DIAGNOSIS — Z125 Encounter for screening for malignant neoplasm of prostate: Secondary | ICD-10-CM | POA: Diagnosis not present

## 2018-08-26 DIAGNOSIS — E785 Hyperlipidemia, unspecified: Secondary | ICD-10-CM | POA: Diagnosis not present

## 2018-08-26 DIAGNOSIS — B029 Zoster without complications: Secondary | ICD-10-CM | POA: Diagnosis not present

## 2018-08-26 DIAGNOSIS — Z008 Encounter for other general examination: Secondary | ICD-10-CM | POA: Diagnosis not present

## 2018-08-26 DIAGNOSIS — I341 Nonrheumatic mitral (valve) prolapse: Secondary | ICD-10-CM | POA: Diagnosis not present

## 2018-10-27 ENCOUNTER — Telehealth: Payer: Self-pay | Admitting: Internal Medicine

## 2018-10-27 MED ORDER — OSELTAMIVIR PHOSPHATE 75 MG PO CAPS: 75.0000 mg | ORAL_CAPSULE | Freq: Every day | ORAL | 0 refills | Status: DC

## 2018-10-27 NOTE — Telephone Encounter (Signed)
In office now with wife who is influenza A positive Will give oseltamivir prophylaxis

## 2019-03-17 DIAGNOSIS — I659 Occlusion and stenosis of unspecified precerebral artery: Secondary | ICD-10-CM | POA: Diagnosis not present

## 2019-03-17 DIAGNOSIS — N529 Male erectile dysfunction, unspecified: Secondary | ICD-10-CM | POA: Diagnosis not present

## 2019-03-20 ENCOUNTER — Other Ambulatory Visit (HOSPITAL_COMMUNITY): Payer: Self-pay | Admitting: Internal Medicine

## 2019-03-20 DIAGNOSIS — R0989 Other specified symptoms and signs involving the circulatory and respiratory systems: Secondary | ICD-10-CM

## 2019-03-28 ENCOUNTER — Other Ambulatory Visit: Payer: Self-pay

## 2019-03-28 ENCOUNTER — Ambulatory Visit (HOSPITAL_COMMUNITY)
Admission: RE | Admit: 2019-03-28 | Discharge: 2019-03-28 | Disposition: A | Discharge status: Home / Self Care | Payer: PPO | Source: Ambulatory Visit | Attending: Internal Medicine | Admitting: Internal Medicine

## 2019-03-28 ENCOUNTER — Encounter (HOSPITAL_COMMUNITY): Payer: BLUE CROSS/BLUE SHIELD

## 2019-03-28 DIAGNOSIS — R0989 Other specified symptoms and signs involving the circulatory and respiratory systems: Secondary | ICD-10-CM

## 2019-06-09 ENCOUNTER — Other Ambulatory Visit: Payer: Self-pay

## 2019-06-09 DIAGNOSIS — Z20828 Contact with and (suspected) exposure to other viral communicable diseases: Secondary | ICD-10-CM | POA: Diagnosis not present

## 2019-06-09 DIAGNOSIS — Z20822 Contact with and (suspected) exposure to covid-19: Secondary | ICD-10-CM

## 2019-06-10 LAB — NOVEL CORONAVIRUS, NAA: SARS-CoV-2, NAA: NOT DETECTED

## 2019-08-29 DIAGNOSIS — Z1211 Encounter for screening for malignant neoplasm of colon: Secondary | ICD-10-CM | POA: Diagnosis not present

## 2019-08-29 DIAGNOSIS — K219 Gastro-esophageal reflux disease without esophagitis: Secondary | ICD-10-CM | POA: Diagnosis not present

## 2019-08-29 DIAGNOSIS — E785 Hyperlipidemia, unspecified: Secondary | ICD-10-CM | POA: Diagnosis not present

## 2019-08-29 DIAGNOSIS — Z008 Encounter for other general examination: Secondary | ICD-10-CM | POA: Diagnosis not present

## 2019-08-29 DIAGNOSIS — M5030 Other cervical disc degeneration, unspecified cervical region: Secondary | ICD-10-CM | POA: Diagnosis not present

## 2019-08-29 DIAGNOSIS — I341 Nonrheumatic mitral (valve) prolapse: Secondary | ICD-10-CM | POA: Diagnosis not present

## 2019-08-29 DIAGNOSIS — Z125 Encounter for screening for malignant neoplasm of prostate: Secondary | ICD-10-CM | POA: Diagnosis not present

## 2019-09-12 ENCOUNTER — Ambulatory Visit: Payer: PPO

## 2019-09-21 ENCOUNTER — Ambulatory Visit: Payer: PPO

## 2019-09-25 ENCOUNTER — Ambulatory Visit: Payer: PPO | Attending: Internal Medicine

## 2019-09-25 DIAGNOSIS — Z23 Encounter for immunization: Secondary | ICD-10-CM

## 2019-09-25 NOTE — Progress Notes (Signed)
   Covid-19 Vaccination Clinic  Name:  CRAIG IONESCU    MRN: [id number] DOB: 07-23-1951  09/25/2019  Mr. Ruberg was observed post Covid-19 immunization for 15 minutes without incidence. He was provided with Vaccine Information Sheet and instruction to access the V-Safe system.   Mr. Rittenberry was instructed to call 911 with any severe reactions post vaccine: Marland Kitchen Difficulty breathing  . Swelling of your face and throat  . A fast heartbeat  . A bad rash all over your body  . Dizziness and weakness    Immunizations Administered    Name Date Dose VIS Date Route   Pfizer COVID-19 Vaccine 09/25/2019  5:31 PM 0.3 mL 07/28/2019 Intramuscular   Manufacturer: [employer], [employer]   Lot: [id number]   NDC: [address]-8

## 2019-09-29 ENCOUNTER — Ambulatory Visit: Payer: PPO

## 2019-10-20 ENCOUNTER — Ambulatory Visit: Payer: PPO | Attending: Internal Medicine

## 2019-10-20 ENCOUNTER — Other Ambulatory Visit: Payer: Self-pay

## 2019-10-20 DIAGNOSIS — Z23 Encounter for immunization: Secondary | ICD-10-CM

## 2019-10-20 NOTE — Progress Notes (Signed)
   Covid-19 Vaccination Clinic  Name:  Randy Reese    MRN: [id number] DOB: Jan 15, 1951  10/20/2019  Randy Reese was observed post Covid-19 immunization for 15 minutes without incident. He was provided with Vaccine Information Sheet and instruction to access the V-Safe system.   Randy Reese was instructed to call 911 with any severe reactions post vaccine: Marland Kitchen Difficulty breathing  . Swelling of face and throat  . A fast heartbeat  . A bad rash all over body  . Dizziness and weakness   Immunizations Administered    Name Date Dose VIS Date Route   Pfizer COVID-19 Vaccine 10/20/2019  4:14 PM 0.3 mL 07/28/2019 Intramuscular   Manufacturer: [employer], [employer]   Lot: [id number]   NDC: [address]-6

## 2020-04-28 ENCOUNTER — Ambulatory Visit: Payer: PPO | Attending: Internal Medicine

## 2020-04-28 ENCOUNTER — Other Ambulatory Visit: Payer: Self-pay

## 2020-04-28 DIAGNOSIS — Z23 Encounter for immunization: Secondary | ICD-10-CM

## 2020-04-28 NOTE — Progress Notes (Signed)
   Covid-19 Vaccination Clinic  Name:  Randy Reese    MRN: [id number] DOB: Jan 31, 1951  04/28/2020  Randy Reese was observed post Covid-19 immunization for 15 minutes without incident. He was provided with Vaccine Information Sheet and instruction to access the V-Safe system.   Randy Reese was instructed to call 911 with any severe reactions post vaccine: Marland Kitchen Difficulty breathing  . Swelling of face and throat  . A fast heartbeat  . A bad rash all over body  . Dizziness and weakness

## 2020-04-29 ENCOUNTER — Other Ambulatory Visit: Payer: Self-pay

## 2020-04-29 ENCOUNTER — Encounter (HOSPITAL_COMMUNITY): Payer: Self-pay | Admitting: Emergency Medicine

## 2020-04-29 ENCOUNTER — Ambulatory Visit (HOSPITAL_COMMUNITY)
Admission: EM | Admit: 2020-04-29 | Discharge: 2020-04-29 | Disposition: A | Discharge status: Home / Self Care | Payer: PPO | Attending: Internal Medicine | Admitting: Internal Medicine

## 2020-04-29 DIAGNOSIS — Z20822 Contact with and (suspected) exposure to covid-19: Secondary | ICD-10-CM | POA: Diagnosis not present

## 2020-04-29 NOTE — ED Triage Notes (Signed)
Patient has had a covid exposure .  Patient does not have symptoms.

## 2020-04-29 NOTE — Discharge Instructions (Signed)

## 2020-04-30 LAB — SARS CORONAVIRUS 2 (TAT 6-24 HRS): SARS Coronavirus 2: NEGATIVE

## 2020-06-24 DIAGNOSIS — H5213 Myopia, bilateral: Secondary | ICD-10-CM | POA: Diagnosis not present

## 2020-06-24 DIAGNOSIS — H52203 Unspecified astigmatism, bilateral: Secondary | ICD-10-CM | POA: Diagnosis not present

## 2020-06-24 DIAGNOSIS — H524 Presbyopia: Secondary | ICD-10-CM | POA: Diagnosis not present

## 2020-06-24 DIAGNOSIS — H2513 Age-related nuclear cataract, bilateral: Secondary | ICD-10-CM | POA: Diagnosis not present

## 2020-06-25 DIAGNOSIS — R12 Heartburn: Secondary | ICD-10-CM | POA: Diagnosis not present

## 2020-06-25 DIAGNOSIS — N529 Male erectile dysfunction, unspecified: Secondary | ICD-10-CM | POA: Diagnosis not present

## 2020-06-25 DIAGNOSIS — R351 Nocturia: Secondary | ICD-10-CM | POA: Diagnosis not present

## 2020-06-25 DIAGNOSIS — R7989 Other specified abnormal findings of blood chemistry: Secondary | ICD-10-CM | POA: Diagnosis not present

## 2020-06-25 DIAGNOSIS — Z23 Encounter for immunization: Secondary | ICD-10-CM | POA: Diagnosis not present

## 2020-09-05 DIAGNOSIS — R7989 Other specified abnormal findings of blood chemistry: Secondary | ICD-10-CM | POA: Diagnosis not present

## 2020-09-05 DIAGNOSIS — R8290 Unspecified abnormal findings in urine: Secondary | ICD-10-CM | POA: Diagnosis not present

## 2020-09-05 DIAGNOSIS — N529 Male erectile dysfunction, unspecified: Secondary | ICD-10-CM | POA: Diagnosis not present

## 2020-09-05 DIAGNOSIS — Z125 Encounter for screening for malignant neoplasm of prostate: Secondary | ICD-10-CM | POA: Diagnosis not present

## 2020-09-09 DIAGNOSIS — N529 Male erectile dysfunction, unspecified: Secondary | ICD-10-CM | POA: Diagnosis not present

## 2020-09-09 DIAGNOSIS — R12 Heartburn: Secondary | ICD-10-CM | POA: Diagnosis not present

## 2020-09-09 DIAGNOSIS — R7989 Other specified abnormal findings of blood chemistry: Secondary | ICD-10-CM | POA: Diagnosis not present

## 2020-09-09 DIAGNOSIS — R351 Nocturia: Secondary | ICD-10-CM | POA: Diagnosis not present

## 2020-09-09 DIAGNOSIS — L989 Disorder of the skin and subcutaneous tissue, unspecified: Secondary | ICD-10-CM | POA: Diagnosis not present

## 2020-09-09 DIAGNOSIS — Z0000 Encounter for general adult medical examination without abnormal findings: Secondary | ICD-10-CM | POA: Diagnosis not present

## 2020-09-09 DIAGNOSIS — R7301 Impaired fasting glucose: Secondary | ICD-10-CM | POA: Diagnosis not present

## 2020-09-09 DIAGNOSIS — Z1212 Encounter for screening for malignant neoplasm of rectum: Secondary | ICD-10-CM | POA: Diagnosis not present

## 2020-10-21 DIAGNOSIS — Z13820 Encounter for screening for osteoporosis: Secondary | ICD-10-CM | POA: Diagnosis not present

## 2020-12-11 DIAGNOSIS — Z20822 Contact with and (suspected) exposure to covid-19: Secondary | ICD-10-CM | POA: Diagnosis not present

## 2021-01-24 DIAGNOSIS — Z8349 Family history of other endocrine, nutritional and metabolic diseases: Secondary | ICD-10-CM | POA: Diagnosis not present

## 2021-01-29 DIAGNOSIS — E210 Primary hyperparathyroidism: Secondary | ICD-10-CM | POA: Diagnosis not present

## 2021-02-05 DIAGNOSIS — E559 Vitamin D deficiency, unspecified: Secondary | ICD-10-CM | POA: Diagnosis not present

## 2021-02-05 DIAGNOSIS — Z841 Family history of disorders of kidney and ureter: Secondary | ICD-10-CM | POA: Diagnosis not present

## 2021-02-05 DIAGNOSIS — E210 Primary hyperparathyroidism: Secondary | ICD-10-CM | POA: Diagnosis not present

## 2021-02-05 DIAGNOSIS — Z8349 Family history of other endocrine, nutritional and metabolic diseases: Secondary | ICD-10-CM | POA: Diagnosis not present

## 2021-03-17 ENCOUNTER — Encounter: Payer: Self-pay | Admitting: Gastroenterology

## 2021-07-22 DIAGNOSIS — E559 Vitamin D deficiency, unspecified: Secondary | ICD-10-CM | POA: Diagnosis not present

## 2021-07-22 DIAGNOSIS — E210 Primary hyperparathyroidism: Secondary | ICD-10-CM | POA: Diagnosis not present

## 2021-07-29 DIAGNOSIS — Z841 Family history of disorders of kidney and ureter: Secondary | ICD-10-CM | POA: Diagnosis not present

## 2021-07-29 DIAGNOSIS — E210 Primary hyperparathyroidism: Secondary | ICD-10-CM | POA: Diagnosis not present

## 2021-07-29 DIAGNOSIS — Z8349 Family history of other endocrine, nutritional and metabolic diseases: Secondary | ICD-10-CM | POA: Diagnosis not present

## 2021-08-07 DIAGNOSIS — H524 Presbyopia: Secondary | ICD-10-CM | POA: Diagnosis not present

## 2021-08-07 DIAGNOSIS — H52203 Unspecified astigmatism, bilateral: Secondary | ICD-10-CM | POA: Diagnosis not present

## 2021-08-07 DIAGNOSIS — H5213 Myopia, bilateral: Secondary | ICD-10-CM | POA: Diagnosis not present

## 2021-08-07 DIAGNOSIS — H2513 Age-related nuclear cataract, bilateral: Secondary | ICD-10-CM | POA: Diagnosis not present

## 2021-09-17 DIAGNOSIS — Z125 Encounter for screening for malignant neoplasm of prostate: Secondary | ICD-10-CM | POA: Diagnosis not present

## 2021-09-17 DIAGNOSIS — Z79899 Other long term (current) drug therapy: Secondary | ICD-10-CM | POA: Diagnosis not present

## 2021-09-17 DIAGNOSIS — R7989 Other specified abnormal findings of blood chemistry: Secondary | ICD-10-CM | POA: Diagnosis not present

## 2021-09-24 DIAGNOSIS — Z1389 Encounter for screening for other disorder: Secondary | ICD-10-CM | POA: Diagnosis not present

## 2021-09-24 DIAGNOSIS — E559 Vitamin D deficiency, unspecified: Secondary | ICD-10-CM | POA: Diagnosis not present

## 2021-09-24 DIAGNOSIS — L989 Disorder of the skin and subcutaneous tissue, unspecified: Secondary | ICD-10-CM | POA: Diagnosis not present

## 2021-09-24 DIAGNOSIS — Z862 Personal history of diseases of the blood and blood-forming organs and certain disorders involving the immune mechanism: Secondary | ICD-10-CM | POA: Diagnosis not present

## 2021-09-24 DIAGNOSIS — R12 Heartburn: Secondary | ICD-10-CM | POA: Diagnosis not present

## 2021-09-24 DIAGNOSIS — N529 Male erectile dysfunction, unspecified: Secondary | ICD-10-CM | POA: Diagnosis not present

## 2021-09-24 DIAGNOSIS — E210 Primary hyperparathyroidism: Secondary | ICD-10-CM | POA: Diagnosis not present

## 2021-09-24 DIAGNOSIS — Z1339 Encounter for screening examination for other mental health and behavioral disorders: Secondary | ICD-10-CM | POA: Diagnosis not present

## 2021-09-24 DIAGNOSIS — Z0000 Encounter for general adult medical examination without abnormal findings: Secondary | ICD-10-CM | POA: Diagnosis not present

## 2021-09-24 DIAGNOSIS — Z1331 Encounter for screening for depression: Secondary | ICD-10-CM | POA: Diagnosis not present

## 2022-08-11 DIAGNOSIS — Z8349 Family history of other endocrine, nutritional and metabolic diseases: Secondary | ICD-10-CM | POA: Diagnosis not present

## 2022-08-11 DIAGNOSIS — E210 Primary hyperparathyroidism: Secondary | ICD-10-CM | POA: Diagnosis not present

## 2022-08-11 DIAGNOSIS — Z841 Family history of disorders of kidney and ureter: Secondary | ICD-10-CM | POA: Diagnosis not present

## 2022-08-24 DIAGNOSIS — H25813 Combined forms of age-related cataract, bilateral: Secondary | ICD-10-CM | POA: Diagnosis not present

## 2022-08-24 DIAGNOSIS — H5213 Myopia, bilateral: Secondary | ICD-10-CM | POA: Diagnosis not present

## 2022-08-24 DIAGNOSIS — H52203 Unspecified astigmatism, bilateral: Secondary | ICD-10-CM | POA: Diagnosis not present

## 2022-08-24 DIAGNOSIS — H524 Presbyopia: Secondary | ICD-10-CM | POA: Diagnosis not present

## 2022-10-02 DIAGNOSIS — N529 Male erectile dysfunction, unspecified: Secondary | ICD-10-CM | POA: Diagnosis not present

## 2022-10-02 DIAGNOSIS — Z79899 Other long term (current) drug therapy: Secondary | ICD-10-CM | POA: Diagnosis not present

## 2022-10-02 DIAGNOSIS — Z125 Encounter for screening for malignant neoplasm of prostate: Secondary | ICD-10-CM | POA: Diagnosis not present

## 2022-10-02 DIAGNOSIS — E559 Vitamin D deficiency, unspecified: Secondary | ICD-10-CM | POA: Diagnosis not present

## 2022-10-02 DIAGNOSIS — D510 Vitamin B12 deficiency anemia due to intrinsic factor deficiency: Secondary | ICD-10-CM | POA: Diagnosis not present

## 2022-10-02 DIAGNOSIS — D649 Anemia, unspecified: Secondary | ICD-10-CM | POA: Diagnosis not present

## 2022-10-02 DIAGNOSIS — R7989 Other specified abnormal findings of blood chemistry: Secondary | ICD-10-CM | POA: Diagnosis not present

## 2022-10-02 DIAGNOSIS — R7301 Impaired fasting glucose: Secondary | ICD-10-CM | POA: Diagnosis not present

## 2022-10-02 DIAGNOSIS — Z862 Personal history of diseases of the blood and blood-forming organs and certain disorders involving the immune mechanism: Secondary | ICD-10-CM | POA: Diagnosis not present

## 2022-10-09 DIAGNOSIS — Z23 Encounter for immunization: Secondary | ICD-10-CM | POA: Diagnosis not present

## 2022-10-09 DIAGNOSIS — R12 Heartburn: Secondary | ICD-10-CM | POA: Diagnosis not present

## 2022-10-09 DIAGNOSIS — Z862 Personal history of diseases of the blood and blood-forming organs and certain disorders involving the immune mechanism: Secondary | ICD-10-CM | POA: Diagnosis not present

## 2022-10-09 DIAGNOSIS — Z1331 Encounter for screening for depression: Secondary | ICD-10-CM | POA: Diagnosis not present

## 2022-10-09 DIAGNOSIS — E210 Primary hyperparathyroidism: Secondary | ICD-10-CM | POA: Diagnosis not present

## 2022-10-09 DIAGNOSIS — R1310 Dysphagia, unspecified: Secondary | ICD-10-CM | POA: Diagnosis not present

## 2022-10-09 DIAGNOSIS — Z1339 Encounter for screening examination for other mental health and behavioral disorders: Secondary | ICD-10-CM | POA: Diagnosis not present

## 2022-10-09 DIAGNOSIS — Z0000 Encounter for general adult medical examination without abnormal findings: Secondary | ICD-10-CM | POA: Diagnosis not present

## 2022-10-09 DIAGNOSIS — E559 Vitamin D deficiency, unspecified: Secondary | ICD-10-CM | POA: Diagnosis not present

## 2022-10-09 DIAGNOSIS — E875 Hyperkalemia: Secondary | ICD-10-CM | POA: Diagnosis not present

## 2022-10-09 DIAGNOSIS — R7301 Impaired fasting glucose: Secondary | ICD-10-CM | POA: Diagnosis not present

## 2022-11-23 ENCOUNTER — Ambulatory Visit (AMBULATORY_SURGERY_CENTER): Payer: PPO | Admitting: *Deleted

## 2022-11-23 VITALS — Ht 71.0 in | Wt 150.0 lb

## 2022-11-23 DIAGNOSIS — Z1211 Encounter for screening for malignant neoplasm of colon: Secondary | ICD-10-CM

## 2022-11-23 MED ORDER — NA SULFATE-K SULFATE-MG SULF 17.5-3.13-1.6 GM/177ML PO SOLN: 1.0000 | Freq: Once | ORAL | 0 refills | Status: AC

## 2022-11-23 NOTE — Progress Notes (Signed)
No egg or soy allergy known to patient  No issues known to pt with past sedation with any surgeries or procedures Patient denies ever being told they had issues or difficulty with intubation  No FH of Malignant Hyperthermia Pt is not on diet pills Pt is not on  home 02  Pt is not on blood thinners  Pt denies issues with constipation  No A fib or A flutter Have any cardiac testing pending--no Pt instructed to use Single[employer]com or GoodRx for a price reduction on prep     Pt.declined hard copy of prep to be sent to him and opted for my chart,decline rx to be sent to different pharmacy. Patient's chart reviewed by Cathlyn Parsons CNRA prior to previsit and patient appropriate for the [facility].  Previsit completed and red dot placed by patient's name on their procedure day (on provider's schedule).

## 2022-11-25 ENCOUNTER — Encounter: Payer: Self-pay | Admitting: Gastroenterology

## 2022-12-07 ENCOUNTER — Encounter: Payer: Self-pay | Admitting: Gastroenterology

## 2022-12-07 ENCOUNTER — Ambulatory Visit (AMBULATORY_SURGERY_CENTER): Payer: PPO | Admitting: Gastroenterology

## 2022-12-07 VITALS — BP 108/68 | HR 73 | Temp 97.8°F | Resp 11 | Ht 71.0 in | Wt 150.0 lb

## 2022-12-07 DIAGNOSIS — K641 Second degree hemorrhoids: Secondary | ICD-10-CM

## 2022-12-07 DIAGNOSIS — K621 Rectal polyp: Secondary | ICD-10-CM | POA: Diagnosis not present

## 2022-12-07 DIAGNOSIS — D128 Benign neoplasm of rectum: Secondary | ICD-10-CM

## 2022-12-07 DIAGNOSIS — Z1211 Encounter for screening for malignant neoplasm of colon: Secondary | ICD-10-CM

## 2022-12-07 DIAGNOSIS — D125 Benign neoplasm of sigmoid colon: Secondary | ICD-10-CM | POA: Diagnosis not present

## 2022-12-07 MED ORDER — SODIUM CHLORIDE 0.9 % IV SOLN: 500.0000 mL | Freq: Once | INTRAVENOUS | Status: DC

## 2022-12-07 NOTE — Progress Notes (Signed)
Called to room to assist during endoscopic procedure.  Patient ID and intended procedure confirmed with present staff. Received instructions for my participation in the procedure from the performing physician.  

## 2022-12-07 NOTE — Progress Notes (Signed)
Report to PACU, RN, vss, BBS= Clear.  

## 2022-12-07 NOTE — Op Note (Signed)
[facility] Patient Name: Randy Reese Procedure Date: 12/07/2022 11:24 AM MRN: [id number] Endoscopist: Doristine Locks , MD, [id number] Age: 72 Referring MD:  Date of Birth: 21-Nov-1950 Gender: Male Account #: [id number] Procedure:                Colonoscopy Indications:              Screening for colorectal malignant neoplasm (last                            colonoscopy was more than 10 years ago)                           Last colonoscopy was 09/2010 and normal. No recent                            GI symptoms. Medicines:                Monitored Anesthesia Care Procedure:                Pre-Anesthesia Assessment:                           - Prior to the procedure, a History and Physical                            was performed, and patient medications and                            allergies were reviewed. The patient's tolerance of                            previous anesthesia was also reviewed. The risks                            and benefits of the procedure and the sedation                            options and risks were discussed with the patient.                            All questions were answered, and informed consent                            was obtained. Prior Anticoagulants: The patient has                            taken no anticoagulant or antiplatelet agents. ASA                            Grade Assessment: II - A patient with mild systemic                            disease. After reviewing the risks and benefits,  the patient was deemed in satisfactory condition to                            undergo the procedure.                           After obtaining informed consent, the colonoscope                            was passed under direct vision. Throughout the                            procedure, the patient's blood pressure, pulse, and                            oxygen saturations were monitored continuously. The                             Olympus CF-HQ190L ([id number]) Colonoscope was                            introduced through the anus and advanced to the the                            cecum, identified by appendiceal orifice and                            ileocecal valve. The colonoscopy was performed                            without difficulty. The patient tolerated the                            procedure well. The quality of the bowel                            preparation was good. The ileocecal valve,                            appendiceal orifice, and rectum were photographed. Scope In: 11:31:09 AM Scope Out: 11:49:11 AM Scope Withdrawal Time: 0 hours 12 minutes 44 seconds  Total Procedure Duration: 0 hours 18 minutes 2 seconds  Findings:                 The perianal and digital rectal examinations were                            normal.                           A 5 mm polyp was found in the sigmoid colon. The                            polyp was sessile. The polyp was removed with a  cold snare. Resection and retrieval were complete.                            Estimated blood loss was minimal.                           A 2 mm polyp was found in the rectum. The polyp was                            sessile. The polyp was removed with a cold snare.                            Resection and retrieval were complete. Estimated                            blood loss was minimal.                           Non-bleeding internal hemorrhoids were found during                            retroflexion. The hemorrhoids were small.                           The exam was otherwise normal throughout the                            remainder of the colon. Complications:            No immediate complications. Estimated Blood Loss:     Estimated blood loss was minimal. Impression:               - One 5 mm polyp in the sigmoid colon, removed with                            a cold snare.  Resected and retrieved.                           - One 2 mm polyp in the rectum, removed with a cold                            snare. Resected and retrieved.                           - Non-bleeding internal hemorrhoids. Recommendation:           - Patient has a contact number available for                            emergencies. The signs and symptoms of potential                            delayed complications were discussed with the  patient. Return to normal activities tomorrow.                            Written discharge instructions were provided to the                            patient.                           - Resume previous diet.                           - Continue present medications.                           - Await pathology results.                           - Repeat colonoscopy for surveillance based on                            pathology results.                           - Return to GI office PRN.                           - Internal hemorrhoids were noted on this study and                            may be amenable to hemorrhoid band ligation. If you                            are interested in further treatment of these                            hemorrhoids with band ligation, please contact my                            clinic to set up an appointment for evaluation and                            treatment. Doristine Locks, MD 12/07/2022 11:53:17 AM

## 2022-12-07 NOTE — Progress Notes (Signed)
Vitals-Randy Reese  Pt's states no medical or surgical changes since previsit or office visit. 

## 2022-12-07 NOTE — Progress Notes (Signed)
GASTROENTEROLOGY PROCEDURE H&P NOTE   Primary Care Physician: Charlane Ferretti, DO    Reason for Procedure:  Colon Cancer screening  Plan:    Colonoscopy  Patient is appropriate for endoscopic procedure(s) in the ambulatory ([facility]) setting.  The nature of the procedure, as well as the risks, benefits, and alternatives were carefully and thoroughly reviewed with the patient. Ample time for discussion and questions allowed. The patient understood, was satisfied, and agreed to proceed.     HPI: Randy Reese is a 72 y.o. male who presents for colonoscopy for routine Colon Cancer screening.  No active GI symptoms.  No known family history of colon cancer or related malignancy.  Patient is otherwise without complaints or active issues today.  - 12/1998: Colonoscopy: Normal - 09/2010: Colonoscopy: Normal  Past Medical History:  Diagnosis Date   Allergy    seasonal   Heart murmur    Hyperlipidemia     Past Surgical History:  Procedure Laterality Date   COLONOSCOPY     HAND SURGERY Left    growth removed from left thumb   SPINE SURGERY      Prior to Admission medications   Medication Sig Start Date End Date Taking? Authorizing Provider  Cholecalciferol (VITAMIN D-3 PO) Take 1,000 Units by mouth daily.   Yes [provider]  rosuvastatin (CRESTOR) 10 MG tablet Take 10 mg by mouth daily. 03/19/20  Yes [provider]  oseltamivir (TAMIFLU) 75 MG capsule Take 1 capsule (75 mg total) by mouth daily. 10/27/18   Karie Schwalbe, MD  pantoprazole (PROTONIX) 40 MG tablet Take 40 mg by mouth daily. Patient not taking: Reported on 11/23/2022    [provider]  sildenafil (VIAGRA) 50 MG tablet 1 tablet as needed, as directed, please use goodrx Orally Once a day for 30 day(s)    [provider]  tobramycin-dexamethasone (TOBRADEX) ophthalmic solution Place 1 drop into the left eye every 4 (four) hours while awake. 05/08/17   Doristine Bosworth, MD     Current Outpatient Medications  Medication Sig Dispense Refill   Cholecalciferol (VITAMIN D-3 PO) Take 1,000 Units by mouth daily.     rosuvastatin (CRESTOR) 10 MG tablet Take 10 mg by mouth daily.     oseltamivir (TAMIFLU) 75 MG capsule Take 1 capsule (75 mg total) by mouth daily. 10 capsule 0   pantoprazole (PROTONIX) 40 MG tablet Take 40 mg by mouth daily. (Patient not taking: Reported on 11/23/2022)     sildenafil (VIAGRA) 50 MG tablet 1 tablet as needed, as directed, please use goodrx Orally Once a day for 30 day(s)     tobramycin-dexamethasone (TOBRADEX) ophthalmic solution Place 1 drop into the left eye every 4 (four) hours while awake. 5 mL 1   Current Facility-Administered Medications  Medication Dose Route Frequency Provider Last Rate Last Admin   0.9 %  sodium chloride infusion  500 mL Intravenous Once Kharizma Lesnick V, DO        Allergies as of 12/07/2022 - Review Complete 12/07/2022  Allergen Reaction Noted   Erythromycin Other (See Comments) 08/16/2016   Aspirin  09/30/2010   Azithromycin Other (See Comments) 11/23/2022   Penicillins  09/30/2010   Erythromycin base Other (See Comments) and Nausea Only 05/10/2017    Family History  Problem Relation Age of Onset   Diabetes Brother    Cancer Brother    Colon cancer Neg Hx    Colon polyps Neg Hx    Crohn's disease Neg Hx  Esophageal cancer Neg Hx    Rectal cancer Neg Hx    Stomach cancer Neg Hx    Ulcerative colitis Neg Hx     Social History   Socioeconomic History   Marital status: Married    Spouse name: Not on file   Number of children: Not on file   Years of education: Not on file   Highest education level: Not on file  Occupational History   Not on file  Tobacco Use   Smoking status: Never   Smokeless tobacco: Never  Vaping Use   Vaping Use: Never used  Substance and Sexual Activity   Alcohol use: Yes    Alcohol/week: 1.0 - 2.0 standard drink of alcohol    Types: 1 - 2 Shots of liquor per  week    Comment: socially   Drug use: No   Sexual activity: Not on file  Other Topics Concern   Not on file  Social History Narrative   Not on file   Social Determinants of Health   Financial Resource Strain: Not on file  Food Insecurity: Not on file  Transportation Needs: Not on file  Physical Activity: Not on file  Stress: Not on file  Social Connections: Not on file  Intimate Partner Violence: Not on file    Physical Exam: Vital signs in last 24 hours:  128/71 (BP Location: Left Arm, Patient Position: Sitting, Cuff Size: Normal)   Pulse 78   Temp 97.8 F (36.6 C) (Temporal)   Ht  (1.803 m)   Wt 150 lb (68 kg)   SpO2 100%   BMI 20.92 kg/m  GEN: NAD EYE: Sclerae anicteric ENT: MMM CV: Non-tachycardic Pulm: CTA b/l GI: Soft, NT/ND NEURO:  Alert & Oriented x 3   Doristine Locks, DO [facility]   12/07/2022 11:21 AM

## 2022-12-07 NOTE — Patient Instructions (Addendum)
   Handouts on polyps,diverticulosis,& hemorrhoids given to you today.   Await pathology results on polyps removed   HANDOUT ON HEMORRHOID BANDING GIVEN TO YOU    YOU HAD AN ENDOSCOPIC PROCEDURE TODAY AT THE [facility]:   Refer to the procedure report that was given to you for any specific questions about what was found during the examination.  If the procedure report does not answer your questions, please call your gastroenterologist to clarify.  If you requested that your care partner not be given the details of your procedure findings, then the procedure report has been included in a sealed envelope for you to review at your convenience later.  YOU SHOULD EXPECT: Some feelings of bloating in the abdomen. Passage of more gas than usual.  Walking can help get rid of the air that was put into your GI tract during the procedure and reduce the bloating. If you had a lower endoscopy (such as a colonoscopy or flexible sigmoidoscopy) you may notice spotting of blood in your stool or on the toilet paper. If you underwent a bowel prep for your procedure, you may not have a normal bowel movement for a few days.  Please Note:  You might notice some irritation and congestion in your nose or some drainage.  This is from the oxygen used during your procedure.  There is no need for concern and it should clear up in a day or so.  SYMPTOMS TO REPORT IMMEDIATELY:  Following lower endoscopy (colonoscopy or flexible sigmoidoscopy):  Excessive amounts of blood in the stool  Significant tenderness or worsening of abdominal pains  Swelling of the abdomen that is new, acute  Fever of 100F or higher   For urgent or emergent issues, a gastroenterologist can be reached at any hour by calling [phone]. Do not use MyChart messaging for urgent concerns.    DIET:  We do recommend a small meal at first, but then you may proceed to your regular diet.  Drink plenty of fluids but you should avoid  alcoholic beverages for 24 hours.  ACTIVITY:  You should plan to take it easy for the rest of today and you should NOT DRIVE or use heavy machinery until tomorrow (because of the sedation medicines used during the test).    FOLLOW UP: Our staff will call the number listed on your records the next business day following your procedure.  We will call around 7:15- 8:00 am to check on you and address any questions or concerns that you may have regarding the information given to you following your procedure. If we do not reach you, we will leave a message.     If any biopsies were taken you will be contacted by phone or by letter within the next 1-3 weeks.  Please call us at [phone] if you have not heard about the biopsies in 3 weeks.    SIGNATURES/CONFIDENTIALITY: You and/or your care partner have signed paperwork which will be entered into your electronic medical record.  These signatures attest to the fact that that the information above on your After Visit Summary has been reviewed and is understood.  Full responsibility of the confidentiality of this discharge information lies with you and/or your care-partner.

## 2022-12-08 ENCOUNTER — Telehealth: Payer: Self-pay

## 2022-12-08 NOTE — Telephone Encounter (Signed)
  Follow up Call-     12/07/2022   11:01 AM 12/07/2022   10:51 AM  Call back number  Post procedure Call Back phone  # [phone]   Permission to leave phone message  Yes     Patient questions:  Do you have a fever, pain , or abdominal swelling? No. Pain Score  0 *  Have you tolerated food without any problems? Yes.    Have you been able to return to your normal activities? Yes.    Do you have any questions about your discharge instructions: Diet   No. Medications  No. Follow up visit  No.  Do you have questions or concerns about your Care? No.  Actions: * If pain score is 4 or above: No action needed, pain <4.

## 2022-12-10 ENCOUNTER — Encounter: Payer: Self-pay | Admitting: Gastroenterology

## 2023-01-01 DIAGNOSIS — K219 Gastro-esophageal reflux disease without esophagitis: Secondary | ICD-10-CM | POA: Diagnosis not present

## 2023-01-01 DIAGNOSIS — B356 Tinea cruris: Secondary | ICD-10-CM | POA: Diagnosis not present

## 2023-01-01 DIAGNOSIS — R310 Gross hematuria: Secondary | ICD-10-CM | POA: Diagnosis not present

## 2023-01-19 DIAGNOSIS — B356 Tinea cruris: Secondary | ICD-10-CM | POA: Diagnosis not present

## 2023-01-19 DIAGNOSIS — N451 Epididymitis: Secondary | ICD-10-CM | POA: Diagnosis not present

## 2023-04-23 DIAGNOSIS — R310 Gross hematuria: Secondary | ICD-10-CM | POA: Diagnosis not present

## 2023-04-23 DIAGNOSIS — R8271 Bacteriuria: Secondary | ICD-10-CM | POA: Diagnosis not present

## 2023-04-23 DIAGNOSIS — R3912 Poor urinary stream: Secondary | ICD-10-CM | POA: Diagnosis not present

## 2023-04-23 DIAGNOSIS — N401 Enlarged prostate with lower urinary tract symptoms: Secondary | ICD-10-CM | POA: Diagnosis not present

## 2023-05-12 DIAGNOSIS — N200 Calculus of kidney: Secondary | ICD-10-CM | POA: Diagnosis not present

## 2023-05-12 DIAGNOSIS — R310 Gross hematuria: Secondary | ICD-10-CM | POA: Diagnosis not present

## 2023-05-12 DIAGNOSIS — D1803 Hemangioma of intra-abdominal structures: Secondary | ICD-10-CM | POA: Diagnosis not present

## 2023-06-03 DIAGNOSIS — J069 Acute upper respiratory infection, unspecified: Secondary | ICD-10-CM | POA: Diagnosis not present

## 2023-06-03 DIAGNOSIS — J040 Acute laryngitis: Secondary | ICD-10-CM | POA: Diagnosis not present

## 2023-06-25 DIAGNOSIS — N5201 Erectile dysfunction due to arterial insufficiency: Secondary | ICD-10-CM | POA: Diagnosis not present

## 2023-06-25 DIAGNOSIS — N200 Calculus of kidney: Secondary | ICD-10-CM | POA: Diagnosis not present

## 2023-06-25 DIAGNOSIS — R3912 Poor urinary stream: Secondary | ICD-10-CM | POA: Diagnosis not present

## 2023-06-25 DIAGNOSIS — N401 Enlarged prostate with lower urinary tract symptoms: Secondary | ICD-10-CM | POA: Diagnosis not present

## 2023-06-25 DIAGNOSIS — R310 Gross hematuria: Secondary | ICD-10-CM | POA: Diagnosis not present

## 2023-08-06 DIAGNOSIS — H35412 Lattice degeneration of retina, left eye: Secondary | ICD-10-CM | POA: Diagnosis not present

## 2023-08-06 DIAGNOSIS — H5213 Myopia, bilateral: Secondary | ICD-10-CM | POA: Diagnosis not present

## 2023-08-06 DIAGNOSIS — H02052 Trichiasis without entropian right lower eyelid: Secondary | ICD-10-CM | POA: Diagnosis not present

## 2023-08-06 DIAGNOSIS — H43813 Vitreous degeneration, bilateral: Secondary | ICD-10-CM | POA: Diagnosis not present

## 2023-08-06 DIAGNOSIS — H35012 Changes in retinal vascular appearance, left eye: Secondary | ICD-10-CM | POA: Diagnosis not present

## 2023-08-06 DIAGNOSIS — H33301 Unspecified retinal break, right eye: Secondary | ICD-10-CM | POA: Diagnosis not present

## 2023-08-06 DIAGNOSIS — H33311 Horseshoe tear of retina without detachment, right eye: Secondary | ICD-10-CM | POA: Diagnosis not present

## 2023-08-06 DIAGNOSIS — H3562 Retinal hemorrhage, left eye: Secondary | ICD-10-CM | POA: Diagnosis not present

## 2023-08-06 DIAGNOSIS — H3500 Unspecified background retinopathy: Secondary | ICD-10-CM | POA: Diagnosis not present

## 2023-08-06 DIAGNOSIS — H2513 Age-related nuclear cataract, bilateral: Secondary | ICD-10-CM | POA: Diagnosis not present

## 2023-08-26 DIAGNOSIS — H43813 Vitreous degeneration, bilateral: Secondary | ICD-10-CM | POA: Diagnosis not present

## 2023-08-26 DIAGNOSIS — H59811 Chorioretinal scars after surgery for detachment, right eye: Secondary | ICD-10-CM | POA: Diagnosis not present

## 2023-08-26 DIAGNOSIS — H33311 Horseshoe tear of retina without detachment, right eye: Secondary | ICD-10-CM | POA: Diagnosis not present

## 2023-10-07 DIAGNOSIS — Z0000 Encounter for general adult medical examination without abnormal findings: Secondary | ICD-10-CM | POA: Diagnosis not present

## 2023-10-07 DIAGNOSIS — E875 Hyperkalemia: Secondary | ICD-10-CM | POA: Diagnosis not present

## 2023-10-07 DIAGNOSIS — E559 Vitamin D deficiency, unspecified: Secondary | ICD-10-CM | POA: Diagnosis not present

## 2023-10-07 DIAGNOSIS — Z0189 Encounter for other specified special examinations: Secondary | ICD-10-CM | POA: Diagnosis not present

## 2023-10-07 DIAGNOSIS — R7301 Impaired fasting glucose: Secondary | ICD-10-CM | POA: Diagnosis not present

## 2023-10-07 DIAGNOSIS — R7989 Other specified abnormal findings of blood chemistry: Secondary | ICD-10-CM | POA: Diagnosis not present

## 2023-10-14 DIAGNOSIS — R7301 Impaired fasting glucose: Secondary | ICD-10-CM | POA: Diagnosis not present

## 2023-10-14 DIAGNOSIS — R1310 Dysphagia, unspecified: Secondary | ICD-10-CM | POA: Diagnosis not present

## 2023-10-14 DIAGNOSIS — E559 Vitamin D deficiency, unspecified: Secondary | ICD-10-CM | POA: Diagnosis not present

## 2023-10-14 DIAGNOSIS — N529 Male erectile dysfunction, unspecified: Secondary | ICD-10-CM | POA: Diagnosis not present

## 2023-10-14 DIAGNOSIS — E210 Primary hyperparathyroidism: Secondary | ICD-10-CM | POA: Diagnosis not present

## 2023-10-14 DIAGNOSIS — L989 Disorder of the skin and subcutaneous tissue, unspecified: Secondary | ICD-10-CM | POA: Diagnosis not present

## 2023-10-14 DIAGNOSIS — Z0000 Encounter for general adult medical examination without abnormal findings: Secondary | ICD-10-CM | POA: Diagnosis not present

## 2023-10-14 DIAGNOSIS — Z1331 Encounter for screening for depression: Secondary | ICD-10-CM | POA: Diagnosis not present

## 2023-10-14 DIAGNOSIS — Z1339 Encounter for screening examination for other mental health and behavioral disorders: Secondary | ICD-10-CM | POA: Diagnosis not present

## 2023-10-14 DIAGNOSIS — Z862 Personal history of diseases of the blood and blood-forming organs and certain disorders involving the immune mechanism: Secondary | ICD-10-CM | POA: Diagnosis not present

## 2023-10-14 DIAGNOSIS — R053 Chronic cough: Secondary | ICD-10-CM | POA: Diagnosis not present

## 2023-11-25 DIAGNOSIS — Z8349 Family history of other endocrine, nutritional and metabolic diseases: Secondary | ICD-10-CM | POA: Diagnosis not present

## 2023-11-25 DIAGNOSIS — Z841 Family history of disorders of kidney and ureter: Secondary | ICD-10-CM | POA: Diagnosis not present

## 2023-11-25 DIAGNOSIS — E210 Primary hyperparathyroidism: Secondary | ICD-10-CM | POA: Diagnosis not present

## 2023-11-26 ENCOUNTER — Encounter (INDEPENDENT_AMBULATORY_CARE_PROVIDER_SITE_OTHER): Payer: Self-pay

## 2023-11-26 ENCOUNTER — Other Ambulatory Visit: Payer: Self-pay | Admitting: Internal Medicine

## 2023-11-26 DIAGNOSIS — E210 Primary hyperparathyroidism: Secondary | ICD-10-CM

## 2023-12-05 ENCOUNTER — Other Ambulatory Visit: Payer: Self-pay | Admitting: Medical Genetics

## 2023-12-06 ENCOUNTER — Other Ambulatory Visit (HOSPITAL_COMMUNITY)
Admission: RE | Admit: 2023-12-06 | Discharge: 2023-12-06 | Disposition: A | Discharge status: Home / Self Care | Payer: Self-pay | Source: Ambulatory Visit | Attending: Oncology | Admitting: Oncology

## 2023-12-17 LAB — GENECONNECT MOLECULAR SCREEN: Genetic Analysis Overall Interpretation: NEGATIVE

## 2024-01-05 DIAGNOSIS — H903 Sensorineural hearing loss, bilateral: Secondary | ICD-10-CM | POA: Diagnosis not present

## 2024-02-10 DIAGNOSIS — H93293 Other abnormal auditory perceptions, bilateral: Secondary | ICD-10-CM | POA: Diagnosis not present

## 2024-02-10 DIAGNOSIS — H903 Sensorineural hearing loss, bilateral: Secondary | ICD-10-CM | POA: Diagnosis not present

## 2024-02-10 DIAGNOSIS — H6993 Unspecified Eustachian tube disorder, bilateral: Secondary | ICD-10-CM | POA: Diagnosis not present

## 2024-02-23 DIAGNOSIS — H35363 Drusen (degenerative) of macula, bilateral: Secondary | ICD-10-CM | POA: Diagnosis not present

## 2024-02-23 DIAGNOSIS — H43813 Vitreous degeneration, bilateral: Secondary | ICD-10-CM | POA: Diagnosis not present

## 2024-02-23 DIAGNOSIS — H40053 Ocular hypertension, bilateral: Secondary | ICD-10-CM | POA: Diagnosis not present

## 2024-02-23 DIAGNOSIS — H59811 Chorioretinal scars after surgery for detachment, right eye: Secondary | ICD-10-CM | POA: Diagnosis not present

## 2024-03-28 DIAGNOSIS — H2513 Age-related nuclear cataract, bilateral: Secondary | ICD-10-CM | POA: Diagnosis not present

## 2024-03-28 DIAGNOSIS — H40053 Ocular hypertension, bilateral: Secondary | ICD-10-CM | POA: Diagnosis not present

## 2024-03-31 DIAGNOSIS — R7301 Impaired fasting glucose: Secondary | ICD-10-CM | POA: Diagnosis not present

## 2024-03-31 DIAGNOSIS — N529 Male erectile dysfunction, unspecified: Secondary | ICD-10-CM | POA: Diagnosis not present

## 2024-03-31 DIAGNOSIS — R12 Heartburn: Secondary | ICD-10-CM | POA: Diagnosis not present

## 2024-03-31 DIAGNOSIS — E559 Vitamin D deficiency, unspecified: Secondary | ICD-10-CM | POA: Diagnosis not present

## 2024-03-31 DIAGNOSIS — R198 Other specified symptoms and signs involving the digestive system and abdomen: Secondary | ICD-10-CM | POA: Diagnosis not present

## 2024-03-31 DIAGNOSIS — R1312 Dysphagia, oropharyngeal phase: Secondary | ICD-10-CM | POA: Diagnosis not present

## 2024-03-31 DIAGNOSIS — R1310 Dysphagia, unspecified: Secondary | ICD-10-CM | POA: Diagnosis not present

## 2024-03-31 DIAGNOSIS — E210 Primary hyperparathyroidism: Secondary | ICD-10-CM | POA: Diagnosis not present

## 2024-03-31 DIAGNOSIS — R053 Chronic cough: Secondary | ICD-10-CM | POA: Diagnosis not present

## 2024-03-31 DIAGNOSIS — J8410 Pulmonary fibrosis, unspecified: Secondary | ICD-10-CM | POA: Diagnosis not present

## 2024-04-07 DIAGNOSIS — H903 Sensorineural hearing loss, bilateral: Secondary | ICD-10-CM | POA: Diagnosis not present

## 2024-04-10 DIAGNOSIS — H6993 Unspecified Eustachian tube disorder, bilateral: Secondary | ICD-10-CM | POA: Diagnosis not present

## 2024-04-10 DIAGNOSIS — R09A2 Foreign body sensation, throat: Secondary | ICD-10-CM | POA: Diagnosis not present

## 2024-04-10 DIAGNOSIS — H903 Sensorineural hearing loss, bilateral: Secondary | ICD-10-CM | POA: Diagnosis not present

## 2024-04-10 DIAGNOSIS — K219 Gastro-esophageal reflux disease without esophagitis: Secondary | ICD-10-CM | POA: Diagnosis not present

## 2024-04-10 DIAGNOSIS — D1039 Benign neoplasm of other parts of mouth: Secondary | ICD-10-CM | POA: Diagnosis not present

## 2024-04-18 DIAGNOSIS — Z1283 Encounter for screening for malignant neoplasm of skin: Secondary | ICD-10-CM | POA: Diagnosis not present

## 2024-04-18 DIAGNOSIS — D2372 Other benign neoplasm of skin of left lower limb, including hip: Secondary | ICD-10-CM | POA: Diagnosis not present

## 2024-04-18 DIAGNOSIS — L738 Other specified follicular disorders: Secondary | ICD-10-CM | POA: Diagnosis not present

## 2024-04-18 DIAGNOSIS — L814 Other melanin hyperpigmentation: Secondary | ICD-10-CM | POA: Diagnosis not present

## 2024-04-18 DIAGNOSIS — D235 Other benign neoplasm of skin of trunk: Secondary | ICD-10-CM | POA: Diagnosis not present

## 2024-04-18 DIAGNOSIS — L821 Other seborrheic keratosis: Secondary | ICD-10-CM | POA: Diagnosis not present

## 2024-04-18 DIAGNOSIS — D2371 Other benign neoplasm of skin of right lower limb, including hip: Secondary | ICD-10-CM | POA: Diagnosis not present

## 2024-05-10 ENCOUNTER — Other Ambulatory Visit (HOSPITAL_COMMUNITY): Payer: Self-pay | Admitting: Internal Medicine

## 2024-05-10 DIAGNOSIS — R053 Chronic cough: Secondary | ICD-10-CM

## 2024-05-10 DIAGNOSIS — R1310 Dysphagia, unspecified: Secondary | ICD-10-CM

## 2024-05-15 ENCOUNTER — Ambulatory Visit (HOSPITAL_COMMUNITY)
Admission: RE | Admit: 2024-05-15 | Discharge: 2024-05-15 | Disposition: A | Discharge status: Home / Self Care | Source: Ambulatory Visit | Attending: Internal Medicine | Admitting: Internal Medicine

## 2024-05-15 DIAGNOSIS — R1310 Dysphagia, unspecified: Secondary | ICD-10-CM | POA: Insufficient documentation

## 2024-05-15 DIAGNOSIS — K219 Gastro-esophageal reflux disease without esophagitis: Secondary | ICD-10-CM | POA: Diagnosis not present

## 2024-05-15 DIAGNOSIS — R053 Chronic cough: Secondary | ICD-10-CM | POA: Insufficient documentation

## 2024-05-15 DIAGNOSIS — K224 Dyskinesia of esophagus: Secondary | ICD-10-CM | POA: Diagnosis not present

## 2024-05-15 DIAGNOSIS — K225 Diverticulum of esophagus, acquired: Secondary | ICD-10-CM | POA: Diagnosis not present

## 2024-05-19 ENCOUNTER — Other Ambulatory Visit (HOSPITAL_COMMUNITY): Payer: Self-pay | Admitting: *Deleted

## 2024-05-19 DIAGNOSIS — R1310 Dysphagia, unspecified: Secondary | ICD-10-CM

## 2024-05-31 ENCOUNTER — Ambulatory Visit (HOSPITAL_COMMUNITY)
Admission: RE | Admit: 2024-05-31 | Discharge: 2024-05-31 | Disposition: A | Source: Ambulatory Visit | Attending: Internal Medicine

## 2024-05-31 ENCOUNTER — Ambulatory Visit (HOSPITAL_COMMUNITY)
Admission: RE | Admit: 2024-05-31 | Discharge: 2024-05-31 | Disposition: A | Discharge status: Home / Self Care | Source: Ambulatory Visit | Attending: Internal Medicine | Admitting: Internal Medicine

## 2024-05-31 DIAGNOSIS — Z79899 Other long term (current) drug therapy: Secondary | ICD-10-CM | POA: Diagnosis not present

## 2024-05-31 DIAGNOSIS — R09A2 Foreign body sensation, throat: Secondary | ICD-10-CM | POA: Insufficient documentation

## 2024-05-31 DIAGNOSIS — R1310 Dysphagia, unspecified: Secondary | ICD-10-CM | POA: Diagnosis not present

## 2024-05-31 DIAGNOSIS — K219 Gastro-esophageal reflux disease without esophagitis: Secondary | ICD-10-CM | POA: Diagnosis not present

## 2024-05-31 DIAGNOSIS — R053 Chronic cough: Secondary | ICD-10-CM | POA: Insufficient documentation

## 2024-05-31 DIAGNOSIS — R059 Cough, unspecified: Secondary | ICD-10-CM | POA: Diagnosis not present

## 2024-05-31 DIAGNOSIS — E213 Hyperparathyroidism, unspecified: Secondary | ICD-10-CM | POA: Insufficient documentation

## 2024-05-31 NOTE — Progress Notes (Signed)
 Modified Barium Swallow Study  Patient Details  Name: Randy Reese MRN: [id number] Date of Birth: 1950/10/05  Today's Date: 05/31/2024  HPI/PMH: HPI: Pt is a 73 yo male referred by Dr Valentin for OP MBS due to pt having chronic dysphagia and coughing with PO.  Per referral form, concern for pt having GERD, chronic cough, microgranulomas of lung, hyperparathyroidism, and hypercalemia.  He is on Protonix due to symptoms of frequent throat clearing and globus, consistent with laryngopharyngeal reflux per ENT.  ENT indicates pt does not frequently experience heartburn, but admits to frequent consumption of reflux inducing foods. 05/15/24 esophagram showed No evidence of esophageal stricture or filling defect. No aspiration or hiatal hernia. 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 2.  Mild dysmotility. 3. Small volume gastroesophageal reflux inducible.  He also admits to occasional issues with refluxing at night.  He reports some coughing and mostly throat clearing after meals mostly.  States globus sensation is chronic but worsens after meals.  Pt recalled esophagram findings today per interview with SLP.  He reports he was informed that he had granuloma tissue on CXR April or May *in MD office.  From chart review, 2008 DG abd and chest showed 1. 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   CXR 07/2016 Sequelae of prior granulomatous infection. No acute cardiopulmonary disease.  Pt reports he took Protonix for approx one month with no improvement with symptoms, thus he stopped it.  He denies needing heimlich manuever, having pneumonias nor significant weight loss. Dysphagia onset was approx 2 years ago with gradual onset.  He was noted to clear his throat before, during and after MBS without barium in larynx.   Clinical Impression: Clinical Impression: Patient presents with functional oropharyngeal swallow ability. No aspiration or penetration of any consistency tested *thin,  nectar, puree, solid*. He was challenged with sequential swallows with adequate airway protection and normal pharyngeal clearance.  Of note, pt cleared his throat throughout session. Given his diagnosis of GERD, this is likely his aspiration risk.  RSI *Reflux symptom index* administered with pt scoring 15/45 and per testing authors a score of 13 may require further evaluation or management.  Thanks for this consult.  Did not provide tablet or conduct A-P as SLP reviewed flouro loops from esophagram.  Factors that may increase risk of adverse event in presence of aspiration Noe & Lianne 2021): No data recorded  Recommendations/Plan: Swallowing Evaluation Recommendations Swallowing Evaluation Recommendations Recommendations: PO diet PO Diet Recommendation: Regular; Thin liquids (Level 0) Liquid Administration via: Cup; Straw Medication Administration: Other (Comment) Supervision: Patient able to self-feed Swallowing strategies  : Slow rate; Small bites/sips Postural changes: Stay upright 30-60 min after meals; Position pt fully upright for meals Oral care recommendations: Oral care BID (2x/day)    Treatment Plan Treatment Plan Treatment recommendations: No treatment recommended at this time Follow-up recommendations: No SLP follow up     Recommendations Recommendations for follow up therapy are one component of a multi-disciplinary discharge planning process, led by the attending physician.  Recommendations may be updated based on patient status, additional functional criteria and insurance authorization.  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 - it was observed)   Boluses Administered: Boluses Administered Boluses Administered: Thin liquids (Level 0); Mildly  thick liquids (Level  2, nectar thick); Puree; Solid     Oral Impairment Domain: Oral Impairment Domain Lip Closure: No labial escape Tongue control during bolus hold: Cohesive bolus between tongue to palatal seal Bolus preparation/mastication: Timely and efficient chewing and mashing Bolus transport/lingual motion: Brisk tongue motion Oral residue: Complete oral clearance Location of oral residue : Floor of mouth Initiation of pharyngeal swallow : Valleculae; Pyriform sinuses     Pharyngeal Impairment Domain: Pharyngeal Impairment Domain Soft palate elevation: No bolus between soft palate (SP)/pharyngeal wall (PW) Laryngeal elevation: Complete superior movement of thyroid  cartilage with complete approximation of arytenoids to epiglottic petiole Anterior hyoid excursion: Complete anterior movement Epiglottic movement: Complete inversion Laryngeal vestibule closure: Complete, no air/contrast in laryngeal vestibule Pharyngeal stripping wave : Present - complete Pharyngeal contraction (A/P view only): N/A Pharyngoesophageal segment opening: Complete distension and complete duration, no obstruction of flow Tongue base retraction: No contrast between tongue base and posterior pharyngeal wall (PPW) Pharyngeal residue: Trace residue within or on pharyngeal structures Location of pharyngeal residue: Tongue base     Esophageal Impairment Domain: Esophageal Impairment Domain Esophageal clearance upright position: -- (did not conduct sweep due to pt having very recent esophagram)    Pill: Pill Consistency administered: -- (DNT -due to pt having recent esophagram)    Penetration/Aspiration Scale Score: Penetration/Aspiration Scale Score 1.  Material does not enter airway: Thin liquids (Level 0); Mildly thick liquids (Level 2, nectar thick); Puree; Solid    Compensatory Strategies: Compensatory Strategies Compensatory strategies: No       General Information: Caregiver  present: No   Diet Prior to this Study: Regular; Thin liquids (Level 0)    Temperature : Normal    Respiratory Status: WFL    Supplemental O2: None (Room air)    History of Recent Intubation: No   Behavior/Cognition: Alert; Cooperative; Pleasant mood  Self-Feeding Abilities: Able to self-feed  Baseline vocal quality/speech: Normal  Volitional Cough: Able to elicit  Volitional Swallow: Able to elicit  Exam Limitations: Other (comment)   Goal Planning: No data recorded No data recorded No data recorded No data recorded No data recorded  Pain: Pain Assessment Pain Assessment: No/denies pain    End of Session: Start Time:SLP Start Time (ACUTE ONLY): 1310  Stop Time: SLP Stop Time (ACUTE ONLY): 1341  Time Calculation:SLP Time Calculation (min) (ACUTE ONLY): 31 min  Charges: SLP Evaluations $ SLP Speech Visit: 1 Visit  SLP Evaluations $Outpatient MBS Swallow: 1 Procedure   SLP visit diagnosis: SLP Visit Diagnosis: Dysphagia, unspecified (R13.10)    Past Medical History:  Past Medical History:  Diagnosis Date   Allergy    seasonal   Heart murmur    Hyperlipidemia    Past Surgical History:  Past Surgical History:  Procedure Laterality Date   COLONOSCOPY     HAND SURGERY Left    growth removed from left thumb   SPINE SURGERY    Madelin POUR, MS [facility] SLP Acute Rehab Services Office [phone]  Nicolas Emmie Caldron 05/31/2024, 2:29 PM

## 2024-07-03 ENCOUNTER — Encounter (INDEPENDENT_AMBULATORY_CARE_PROVIDER_SITE_OTHER): Payer: Self-pay

## 2024-07-10 ENCOUNTER — Ambulatory Visit

## 2024-07-10 VITALS — BP 134/75 | HR 77 | Temp 97.9°F | Ht 71.0 in | Wt 152.0 lb

## 2024-07-10 DIAGNOSIS — R918 Other nonspecific abnormal finding of lung field: Secondary | ICD-10-CM

## 2024-07-10 DIAGNOSIS — J392 Other diseases of pharynx: Secondary | ICD-10-CM

## 2024-07-10 DIAGNOSIS — R053 Chronic cough: Secondary | ICD-10-CM

## 2024-07-10 DIAGNOSIS — R0989 Other specified symptoms and signs involving the circulatory and respiratory systems: Secondary | ICD-10-CM | POA: Diagnosis not present

## 2024-07-10 LAB — CBC WITH DIFFERENTIAL/PLATELET
Basophils Absolute: 0 K/uL (ref 0.0–0.1)
Basophils Relative: 0.7 % (ref 0.0–3.0)
Eosinophils Absolute: 0.1 K/uL (ref 0.0–0.7)
Eosinophils Relative: 2 % (ref 0.0–5.0)
HCT: 42.9 % (ref 39.0–52.0)
Hemoglobin: 14.8 g/dL (ref 13.0–17.0)
Lymphocytes Relative: 21.4 % (ref 12.0–46.0)
Lymphs Abs: 1.2 K/uL (ref 0.7–4.0)
MCHC: 34.6 g/dL (ref 30.0–36.0)
MCV: 87.3 fl (ref 78.0–100.0)
Monocytes Absolute: 0.5 K/uL (ref 0.1–1.0)
Monocytes Relative: 9.4 % (ref 3.0–12.0)
Neutro Abs: 3.6 K/uL (ref 1.4–7.7)
Neutrophils Relative %: 66.5 % (ref 43.0–77.0)
Platelets: 276 K/uL (ref 150.0–400.0)
RBC: 4.92 Mil/uL (ref 4.22–5.81)
RDW: 12.8 % (ref 11.5–15.5)
WBC: 5.4 K/uL (ref 4.0–10.5)

## 2024-07-10 LAB — VITAMIN D 25 HYDROXY (VIT D DEFICIENCY, FRACTURES): VITD: 26.93 ng/mL — ABNORMAL LOW (ref 30.00–100.00)

## 2024-07-10 LAB — CALCIUM: Calcium: 10.7 mg/dL — ABNORMAL HIGH (ref 8.4–10.5)

## 2024-07-10 MED ORDER — ALBUTEROL SULFATE HFA 108 (90 BASE) MCG/ACT IN AERS: 2.0000 | INHALATION_SPRAY | Freq: Four times a day (QID) | RESPIRATORY_TRACT | 6 refills | Status: DC | PRN

## 2024-07-10 MED ORDER — LORATADINE 10 MG PO TABS
10.0000 mg | ORAL_TABLET | Freq: Every day | ORAL | 11 refills | Status: AC
Start: 2024-07-10 — End: ?

## 2024-07-10 NOTE — Patient Instructions (Signed)
 It was a pleasure to see you today. Please have your labs drawn in our clinic today. Your will receive a call for your CT chest scheduling.

## 2024-07-10 NOTE — Progress Notes (Signed)
 "  New Patient Pulmonology Office Visit   Subjective:  Patient ID: Randy Reese, male    DOB: June 06, 1951  MRN: [id number]  Referred by: Valentin Skates, DO  CC:  Chief Complaint  Patient presents with   Consult    Constant clearing of throat. Started years ago and has progressively gotten worse    HPI Randy Reese is a 73 y.o. male who is referred to clinic for chronic cough. Pt reports it is mostly constant clearing of throat.  Discussed the use of AI scribe software for clinical note transcription with the patient, who gave verbal consent to proceed.  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  He experiences chronic throat irritation and cough, which he suspects may be related to acid reflux. The symptoms are not worse in the morning and improve overnight. He does not experience worsening symptoms when lying flat, as he usually sleeps on his side. He has tried pantoprazole 40 mg daily for about a month, but it did not significantly improve his symptoms and caused side effects such as diarrhea.  He has mild seasonal allergies, causing sinus congestion and a feeling of fullness, but no runny nose or post-nasal drip. He has not been tested for allergies and does not take allergy medications regularly, only occasionally. He tried Flonase for about a month without noticeable improvement.  He underwent two swallow studies in the past two to three months, which showed no abnormalities. He has no trouble swallowing, neurological problems, weakness, or numbness. He has a history of a granulomatous infection in his lung, identified in a 2017 x-ray, with subsequent x-rays showing similar findings. He recalls an episode of illness in college, possibly mycoplasmic pneumonia, which responded to antibiotics.  He has high calcium  levels, which have been gradually increasing over the  years, with normal urine calcium  levels. He is monitored by an endocrinologist for this condition and takes vitamin D  supplements as directed. There is a family history of parathyroid issues, as his brother had a parathyroid gland removed. He does not take calcium  supplements and has no significant joint problems.  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    ROS Review of symptoms negative except mentioned above  Allergies: Erythromycin, Aspirin, Azithromycin, Penicillins, and Erythromycin base  Current Outpatient Medications:    Cholecalciferol (VITAMIN D -3 PO), Take 1,000 Units by mouth daily., Disp: , Rfl:    loratadine  (CLARITIN ) 10 MG tablet, Take 1 tablet (10 mg total) by mouth daily., Disp: 30 tablet, Rfl: 11   rosuvastatin  (CRESTOR ) 10 MG tablet, Take 10 mg by mouth daily., Disp: , Rfl:    sildenafil (VIAGRA) 50 MG tablet, 1 tablet as needed, as directed, please use goodrx Orally Once a day for 30 day(s) (Patient taking differently: as needed.), Disp: , Rfl:    oseltamivir  (TAMIFLU ) 75 MG capsule, Take 1 capsule (75 mg total) by mouth daily., Disp: 10 capsule, Rfl: 0   pantoprazole (PROTONIX) 40 MG tablet, Take 40 mg by mouth daily. (Patient not taking: Reported on 11/23/2022), Disp: , Rfl:    tobramycin -dexamethasone  (TOBRADEX ) ophthalmic solution, Place 1 drop into the left eye every 4 (four) hours while awake., Disp: 5 mL, Rfl: 1 Past Medical History:  Diagnosis Date   Allergy    seasonal   Heart murmur    Hyperlipidemia    Past Surgical History:  Procedure  Laterality Date   COLONOSCOPY     HAND SURGERY Left    growth removed from left thumb   SPINE SURGERY     Family History  Problem Relation Age of Onset   Diabetes Brother    Cancer Brother    Colon cancer Neg Hx    Colon polyps Neg Hx    Crohn's disease Neg Hx    Esophageal cancer Neg Hx    Rectal cancer Neg Hx    Stomach cancer Neg Hx    Ulcerative colitis Neg Hx    Social History   Socioeconomic  History   Marital status: Married    Spouse name: Not on file   Number of children: Not on file   Years of education: Not on file   Highest education level: Not on file  Occupational History   Not on file  Tobacco Use   Smoking status: Never   Smokeless tobacco: Never  Vaping Use   Vaping status: Never Used  Substance and Sexual Activity   Alcohol  use: Yes    Alcohol /week: 1.0 - 2.0 standard drink of alcohol     Types: 1 - 2 Shots of liquor per week    Comment: socially   Drug use: No   Sexual activity: Not on file  Other Topics Concern   Not on file  Social History Narrative   Not on file   Social Drivers of Health   Financial Resource Strain: Not on file  Food Insecurity: Not on file  Transportation Needs: Not on file  Physical Activity: Not on file  Stress: Not on file  Social Connections: Not on file  Intimate Partner Violence: Not on file         Objective:  BP 134/75   Pulse 77   Temp 97.9 F (36.6 C) (Oral)   Ht 5' 11 (1.803 m)   Wt 152 lb (68.9 kg)   SpO2 98%   BMI 21.20 kg/m    Physical Exam Constitutional:      General: He is not in acute distress.    Appearance: Normal appearance.  HENT:     Nose: Congestion present.     Mouth/Throat:     Mouth: Mucous membranes are moist.  Cardiovascular:     Rate and Rhythm: Normal rate.  Pulmonary:     Effort: No respiratory distress.     Breath sounds: No wheezing or rales.  Musculoskeletal:     Right lower leg: No edema.     Left lower leg: No edema.  Skin:    General: Skin is warm.  Neurological:     Mental Status: He is alert and oriented to person, place, and time.  Psychiatric:        Mood and Affect: Mood normal.     Diagnostic Review:    Pft     No data to display               Results LABS Calcium : 10.6-10.8 (07/2022) Urine calcium : Normal Vitamin D : 24.1 (07/2022)  RADIOLOGY Chest x-ray: Sequela of prior granulomatous infection in lung (2017)         Assessment & Plan:   Assessment & Plan Chronic cough I explained to the patient in detail that chronic cough could be a manifestation of chronic sinusitis with post nasal drip, cough variant asthma, Nonasthmatic eosinophilic bronchitis, gastroesophageal reflux, laryngopharyngeal reflux, parenchymal lung disease like fibrosis, pulmonary edema from congestive heart failure, habitual cough or lung cancer. Pt reports more than  cough it is constant throat irritation He is not excited about diagnostic tests or starting new empiric medications and denies any trouble breathing or cough Will hold off on empiric treatment of cough variant asthma or PFT Since he has hx of chronic unexplained hypercalcemia and evidence of stable nodules in lungs, offered ct chest to r/o sarcoidosis. Will also r/o ILD Orders:   CBC with Differential   IgE; Future   Calcium ; Future   COMPLETE METABOLIC PANEL WITH eGFR   Angiotensin converting enzyme; Future   Vitamin D  1,25 dihydroxy; Future   Vitamin D  (25 hydroxy); Future   CT CHEST HIGH RESOLUTION; Future  Hypercalcemia Chronic Being monitored He takes vit D for low vit D 25. Will recheck levels Orders:   CBC with Differential   IgE; Future   Calcium ; Future   COMPLETE METABOLIC PANEL WITH eGFR   Angiotensin converting enzyme; Future   Vitamin D  1,25 dihydroxy; Future   Vitamin D  (25 hydroxy); Future   CT CHEST HIGH RESOLUTION; Future  Lung nodules Scheduled ct chest for better visualization  Orders:   CBC with Differential   IgE; Future   Calcium ; Future   COMPLETE METABOLIC PANEL WITH eGFR   Angiotensin converting enzyme; Future   Vitamin D  1,25 dihydroxy; Future   Vitamin D  (25 hydroxy); Future   CT CHEST HIGH RESOLUTION; Future  Throat irritation Could be sinus drainage related vs gerd vs habitual cough Ongoing for years without pulmonary symptoms Will prescribe Claritin  for daily use Since his throat irritation is worse on lying 1 side at  night, also advised patient to discuss with his GI provider regarding upper GI endoscopy or other GERD medications since he had adverse effect with PPI Orders:   CT CHEST HIGH RESOLUTION; Future    Thank you for the opportunity to take part in the care of HUXLEY SHURLEY   Return in about 3 months (around 10/10/2024).   Eliceo Gladu Pleas, MD [facility] Office: [phone]  I spent 60 minutes in the care of IDRISS QUACKENBUSH today including reviewing outside labs from endocrinologist at [facility] (calcium , vit d), face to face time discussing treatment options (albuterol , antihistaminic, PPI, H2 blocker), reviewing records from endocrine, PCP ( ), and documenting in the encounter.  "

## 2024-07-12 ENCOUNTER — Ambulatory Visit: Payer: Self-pay

## 2024-07-12 DIAGNOSIS — R053 Chronic cough: Secondary | ICD-10-CM

## 2024-07-14 LAB — COMPLETE METABOLIC PANEL WITHOUT GFR
AG Ratio: 2 (calc) (ref 1.0–2.5)
ALT: 21 U/L (ref 9–46)
AST: 23 U/L (ref 10–35)
Albumin: 4.5 g/dL (ref 3.6–5.1)
Alkaline phosphatase (APISO): 79 U/L (ref 35–144)
BUN: 25 mg/dL (ref 7–25)
CO2: 32 mmol/L (ref 20–32)
Calcium: 11.2 mg/dL — ABNORMAL HIGH (ref 8.6–10.3)
Chloride: 102 mmol/L (ref 98–110)
Creat: 0.94 mg/dL (ref 0.70–1.28)
Globulin: 2.2 g/dL (ref 1.9–3.7)
Glucose, Bld: 87 mg/dL (ref 65–99)
Potassium: 4.9 mmol/L (ref 3.5–5.3)
Sodium: 140 mmol/L (ref 135–146)
Total Bilirubin: 1 mg/dL (ref 0.2–1.2)
Total Protein: 6.7 g/dL (ref 6.1–8.1)

## 2024-07-14 LAB — IGE: IgE (Immunoglobulin E), Serum: 4 kU/L (ref <=114)

## 2024-07-14 LAB — VITAMIN D 1,25 DIHYDROXY
Vitamin D 1, 25 (OH)2 Total: 62 pg/mL (ref 18–72)
Vitamin D2 1, 25 (OH)2: <8 pg/mL
Vitamin D3 1, 25 (OH)2: 62 pg/mL

## 2024-07-14 LAB — ANGIOTENSIN CONVERTING ENZYME: Angiotensin-Converting Enzyme: 54 U/L (ref 9–67)

## 2024-07-19 ENCOUNTER — Ambulatory Visit: Payer: Self-pay

## 2024-07-19 ENCOUNTER — Other Ambulatory Visit: Payer: Self-pay

## 2024-07-19 ENCOUNTER — Other Ambulatory Visit

## 2024-07-19 DIAGNOSIS — R053 Chronic cough: Secondary | ICD-10-CM

## 2024-07-19 LAB — COMPREHENSIVE METABOLIC PANEL WITH GFR
ALT: 25 U/L (ref 0–53)
AST: 25 U/L (ref 0–37)
Albumin: 4.6 g/dL (ref 3.5–5.2)
Alkaline Phosphatase: 81 U/L (ref 39–117)
BUN: 21 mg/dL (ref 6–23)
CO2: 31 meq/L (ref 19–32)
Calcium: 10.7 mg/dL — ABNORMAL HIGH (ref 8.4–10.5)
Chloride: 102 meq/L (ref 96–112)
Creatinine, Ser: 0.95 mg/dL (ref 0.40–1.50)
GFR: 80.57 mL/min (ref >60.00)
Glucose, Bld: 101 mg/dL — ABNORMAL HIGH (ref 70–99)
Potassium: 4.6 meq/L (ref 3.5–5.1)
Sodium: 139 meq/L (ref 135–145)
Total Bilirubin: 0.9 mg/dL (ref 0.2–1.2)
Total Protein: 7.1 g/dL (ref 6.0–8.3)

## 2024-07-19 NOTE — Progress Notes (Signed)
 Redone cmp order for Dr Pleas

## 2024-07-20 ENCOUNTER — Ambulatory Visit: Admission: RE | Admit: 2024-07-20 | Discharge: 2024-07-20 | Disposition: A | Source: Ambulatory Visit

## 2024-07-20 DIAGNOSIS — R053 Chronic cough: Secondary | ICD-10-CM | POA: Diagnosis not present

## 2024-07-20 DIAGNOSIS — R918 Other nonspecific abnormal finding of lung field: Secondary | ICD-10-CM

## 2024-07-20 DIAGNOSIS — J479 Bronchiectasis, uncomplicated: Secondary | ICD-10-CM | POA: Diagnosis not present

## 2024-07-20 DIAGNOSIS — J392 Other diseases of pharynx: Secondary | ICD-10-CM

## 2024-07-20 NOTE — Progress Notes (Signed)
 Patient aware, verbalized understanding. Nothing farther needed

## 2024-07-26 DIAGNOSIS — L538 Other specified erythematous conditions: Secondary | ICD-10-CM | POA: Diagnosis not present

## 2024-07-26 DIAGNOSIS — D1801 Hemangioma of skin and subcutaneous tissue: Secondary | ICD-10-CM | POA: Diagnosis not present

## 2024-07-27 ENCOUNTER — Encounter (HOSPITAL_BASED_OUTPATIENT_CLINIC_OR_DEPARTMENT_OTHER): Payer: Self-pay | Admitting: *Deleted

## 2024-08-04 ENCOUNTER — Other Ambulatory Visit

## 2024-08-07 ENCOUNTER — Ambulatory Visit (HOSPITAL_BASED_OUTPATIENT_CLINIC_OR_DEPARTMENT_OTHER): Admitting: Cardiovascular Disease

## 2024-08-07 ENCOUNTER — Encounter (HOSPITAL_BASED_OUTPATIENT_CLINIC_OR_DEPARTMENT_OTHER): Payer: Self-pay | Admitting: Cardiovascular Disease

## 2024-08-07 VITALS — BP 126/70 | HR 69 | Ht 71.0 in | Wt 155.5 lb

## 2024-08-07 DIAGNOSIS — I700 Atherosclerosis of aorta: Secondary | ICD-10-CM | POA: Insufficient documentation

## 2024-08-07 DIAGNOSIS — Z7689 Persons encountering health services in other specified circumstances: Secondary | ICD-10-CM | POA: Diagnosis not present

## 2024-08-07 DIAGNOSIS — I2510 Atherosclerotic heart disease of native coronary artery without angina pectoris: Secondary | ICD-10-CM | POA: Insufficient documentation

## 2024-08-07 DIAGNOSIS — R053 Chronic cough: Secondary | ICD-10-CM | POA: Diagnosis not present

## 2024-08-07 DIAGNOSIS — E785 Hyperlipidemia, unspecified: Secondary | ICD-10-CM | POA: Diagnosis not present

## 2024-08-07 DIAGNOSIS — Z5181 Encounter for therapeutic drug level monitoring: Secondary | ICD-10-CM

## 2024-08-07 DIAGNOSIS — E7800 Pure hypercholesterolemia, unspecified: Secondary | ICD-10-CM | POA: Insufficient documentation

## 2024-08-07 MED ORDER — ROSUVASTATIN CALCIUM 20 MG PO TABS: 20.0000 mg | ORAL_TABLET | Freq: Every day | ORAL | 3 refills | Status: AC

## 2024-08-07 NOTE — Patient Instructions (Signed)
 Medication Instructions:  INCREASE YOUR ROSUVASTATIN  TO 20 MG DAILY   *If you need a refill on your cardiac medications before your next appointment, please call your pharmacy*  Lab Work: FASTING LP/CMET/LPa/HS-CRP IN COUPLE MONTHS   If you have labs (blood work) drawn today and your tests are completely normal, you will receive your results only by: MyChart Message (if you have MyChart) OR A paper copy in the mail If you have any lab test that is abnormal or we need to change your treatment, we will call you to review the results.  Testing/Procedures: NONE  Follow-Up: At [facility], you and your health needs are our priority.  As part of our continuing mission to provide you with exceptional heart care, our providers are all part of one team.  This team includes your primary Cardiologist (physician) and Advanced Practice Providers or APPs (Physician Assistants and Nurse Practitioners) who all work together to provide you with the care you need, when you need it.  Your next appointment:   6 month(s)  Provider:   Annabella Scarce, MD    We recommend signing up for the patient portal called MyChart.  Sign up information is provided on this After Visit Summary.  MyChart is used to connect with patients for Virtual Visits (Telemedicine).  Patients are able to view lab/test results, encounter notes, upcoming appointments, etc.  Non-urgent messages can be sent to your provider as well.   To learn more about what you can do with MyChart, go to [email or web address].

## 2024-08-07 NOTE — Progress Notes (Signed)
 " Cardiology Office Note:  .   Date:  08/07/2024  ID:  STEEN BISIG, DOB 06-Aug-1951, MRN [id number] PCP: Valentin Skates, DO  [facility] Providers Cardiologist:  None    History of Present Illness: .   Randy Reese is a 73 y.o. male with coronary calcification, aortic atherosclerosis, hyperlipidemia, hypercalcemia, granulomatous lung disease here for evaluation of coronary calcification.  He had a chest CT 07/2024 in the setting of an evaluation for chronic cough.  He saw pulmonary 06/2024 and reported chronic cough and throat irritation that he suspected was related to acid reflux.  Symptoms had not improved with pantoprazole.  He has a history of stable nodules thought due to prior mycoplasma pneumonia infection.  There was some concern question about sarcoidosis and he was referred for chest CT 07/2024 which revealed a nonobstructive renal stone, LAD calcification and no evidence of sarcoidosis.  Discussed the use of AI scribe software for clinical note transcription with the patient, who gave verbal consent to proceed.  History of Present Illness Mr. Huesman reports no chest pain, pressure, heaviness, or changes in breathing. He remains active, engaging in walking and house projects, including disassembling scaffolding without issues.  He follows a well-rounded diet, occasionally consuming sweets. He uses [employer] for meals, which includes fresh vegetables, and avoids fried foods at home, though he occasionally eats French fries when dining out. He consumes one cup of coffee daily.  He has been on rosuvastatin  for several years without muscle aches or other side effects. His cholesterol was last checked in February, with no changes in medication dosage since then.  Family history of heart disease: father had triple bypass surgery, paternal grandfather and great-grandfather died of heart attacks in their mid-fifties, maternal grandfather died of a heart attack at 77.  He mentions a  calcium  imbalance and sees an endocrinologist annually for hypercalcemia. He has been evaluated for sarcoidosis after a CT scan was performed to investigate a granuloma.  Frequent throat clearing and a tendency to get epistaxis, particularly in winter or dry air conditions.  ROS:  As per HPI  Studies Reviewed: SABRA   EKG Interpretation Date/Time:  Monday August 07 2024 09:32:34 EST Ventricular Rate:  73 PR Interval:  208 QRS Duration:  102 QT Interval:  370 QTC Calculation: 407 R Axis:   66  Text Interpretation: Normal sinus rhythm Moderate voltage criteria for LVH, may be normal variant ( Sokolow-Lyon , Cornell product ) No previous ECGs available Confirmed by Raford Riggs ([id number]) on 08/07/2024 10:10:35 AM   Chest CT 07/2024: IMPRESSION: 1. No interstitial lung disease. No acute cardiopulmonary process. 2. Incidental findings include: Left anterior descending coronary atherosclerosis; Nonobstructing 3 mm upper left renal stone.  Risk Assessment/Calculations:             Physical Exam:   VS:  BP 126/70   Pulse 69   Ht 5' 11 (1.803 m)   Wt 155 lb 8 oz (70.5 kg)   SpO2 97%   BMI 21.69 kg/m  , BMI Body mass index is 21.69 kg/m. GENERAL:  Well appearing HEENT: Pupils equal round and reactive, fundi not visualized, oral mucosa unremarkable NECK:  No jugular venous distention, waveform within normal limits, carotid upstroke brisk and symmetric, no bruits, no thyromegaly LUNGS:  Clear to auscultation bilaterally HEART:  RRR.  PMI not displaced or sustained,S1 and S2 within normal limits, no S3, no S4, no clicks, no rubs, no murmurs ABD:  Flat, positive bowel sounds normal  in frequency in pitch, no bruits, no rebound, no guarding, no midline pulsatile mass, no hepatomegaly, no splenomegaly EXT:  2 plus pulses throughout, no edema, no cyanosis no clubbing SKIN:  No rashes no nodules NEURO:  Cranial nerves II through XII grossly intact, motor grossly intact throughout PSYCH:   Cognitively intact, oriented to person place and time   ASSESSMENT AND PLAN: .    Assessment & Plan # Atherosclerotic cardiovascular disease (coronary and aortic) Calcium  buildup in coronary and aortic arteries without symptoms. CT shows calcification in left anterior descending artery and aorta.  Images were viewed with Mr. Coufal in the office today.  No significant obstruction suspected given his lack of ischemic symptoms. Current LDL is 92, above target for coronary disease. Emphasized aggressive prevention strategies. - Increased rosuvastatin  to 20 mg daily. - Ordered LP(a) and high-sensitivity CRP tests to assess cardiovascular risk. - Encouraged more aggressive lifestyle modifications, including increased physical activity and a more plant-based diet. - Will follow up in six months to reassess and adjust treatment as necessary.  # Hyperlipidemia LDL cholesterol at 92, above target for coronary disease. Emphasized lowering LDL to below 70, ideally below 55. Discussed potential side effects of increased rosuvastatin  dosage. Alternative treatments like Repatha discussed if statins are not tolerated. - Increased rosuvastatin  to 20 mg daily. - Ordered LP(a) and high-sensitivity CRP tests to assess cardiovascular risk. - Encouraged dietary modifications to lower LDL, including a more plant-based diet. - Instructed to monitor for muscle aches or other side effects of increased rosuvastatin  dosage.     Dispo: f/u 6 months  Signed, Annabella Scarce, MD   "

## 2024-08-28 NOTE — Progress Notes (Unsigned)
 "    08/29/2024 Randy Reese Pack [id number] [birth year]  Referring provider: Valentin Skates, DO Primary GI doctor: Dr. 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 - Provided Reflux Gourmet samples for one month trial. - Discussed famotidine  as alternative if needed. - Recommended Astelin  nasal spray for post-nasal drip, with option to use with or instead of Claritin . - Advised sugar-free candies and voice rest for throat irritation. - Ordered H. pylori stool antigen test. - Deferred endoscopy as he declined and has no alarm symptoms, consider if worsening symptoms - consider referral to ENT/voice therapy  Personal history of polyps 12/07/2022 colonoscopy good bowel prep 5 mm TA polyp sigmoid colon 2 mm polyp rectum nonbleeding internal hemorrhoid  recall 7 years  Patient Care Team: Valentin Skates, DO as PCP - General (Internal Medicine)  HISTORY OF PRESENT ILLNESS: 74 y.o. male with a past medical history listed below presents for evaluation of throat clearing.   Discussed the use of AI scribe software for clinical note transcription with the patient, who gave verbal consent to proceed.  History of Present Illness   Randy Reese is a 74 year old male with esophageal  dysmotility and Zenker's diverticulum who presents for evaluation of chronic throat clearing.  Chronic throat clearing has persisted for years with minimal progression and little impact on daily life. He is generally unaware of the symptom unless prompted and expresses little concern. No food or pills get stuck, no sensation of food moving slowly, and no need to drink water to facilitate swallowing. Occasionally, he perceives a mild sensation when swallowing, described as almost like there's a little...couch or something, but nothing gets stuck. No hoarseness, cough with food or drink, aspiration, or recurrent pneumonias. His older brother has similar symptoms, and his wife is accustomed to the throat clearing.  Workup has shown mild esophageal dysmotility and a very small Zenker's diverticulum. Barium swallow in September 2024 revealed mild dysmotility and small volume reflux; modified barium swallow in October 2024 was normal. No endoscopy performed. CT chest was unremarkable.  Rare heartburn occurs, managed by avoiding eating close to bedtime and sleeping on his right side. He avoids fried foods but does not follow a specific diet. No frequent heartburn, regurgitation, or significant dietary triggers. No melena, abdominal pain, or significant belching.  Pantoprazole was taken daily for one month but discontinued due to loose stools and leakage, with no perceived benefit. He currently takes Claritin . Flonase nasal spray was used for one month in the past for unrelated reasons, without significant impact on symptoms. Aleve is used rarely for headaches.  Occasional sinus drainage and mild sinus pressure occur, with some post-nasal drip and a drippy nose, but no significant runny nose. No frequent headaches or other upper respiratory symptoms. He does not smoke or use drugs and drinks alcohol  about once a week.      He  reports that he has never smoked. He  has never used smokeless tobacco. He reports  current alcohol  use of about 1.0 - 2.0 standard drink of alcohol  per week. He reports that he does not use drugs.  RELEVANT GI HISTORY, IMAGING AND LABS: Results   Radiology CT chest: Normal  Diagnostic Colonoscopy (08/15/2024): Two diminutive polyps excised, internal hemorrhoids, otherwise unremarkable, seven-year surveillance interval advised Barium swallow (September 2025): Mild esophageal dysmotility, minimal gastroesophageal reflux, no evidence of stricture, small Zenker's diverticulum Modified barium swallow (October 2025): Normal oropharyngeal function, no aspiration, intact airway protection      CBC    Component Value Date/Time   WBC 5.4 07/10/2024 0955   RBC 4.92 07/10/2024 0955   HGB 14.8 07/10/2024 0955   HCT 42.9 07/10/2024 0955   PLT 276.0 07/10/2024 0955   MCV 87.3 07/10/2024 0955   MCHC 34.6 07/10/2024 0955   RDW 12.8 07/10/2024 0955   LYMPHSABS 1.2 07/10/2024 0955   MONOABS 0.5 07/10/2024 0955   EOSABS 0.1 07/10/2024 0955   BASOSABS 0.0 07/10/2024 0955   Recent Labs    07/10/24 0955  HGB 14.8    CMP     Component Value Date/Time   NA 139 07/19/2024 0851   K 4.6 07/19/2024 0851   CL 102 07/19/2024 0851   CO2 31 07/19/2024 0851   GLUCOSE 101 (H) 07/19/2024 0851   BUN 21 07/19/2024 0851   CREATININE 0.95 07/19/2024 0851   CREATININE 0.94 07/10/2024 0955   CALCIUM  10.7 (H) 07/19/2024 0851   PROT 7.1 07/19/2024 0851   ALBUMIN 4.6 07/19/2024 0851   AST 25 07/19/2024 0851   ALT 25 07/19/2024 0851   ALKPHOS 81 07/19/2024 0851   BILITOT 0.9 07/19/2024 0851   GFRNONAA >60 07/12/2007 1100   GFRAA  07/12/2007 1100    >60        The eGFR has been calculated using the MDRD equation. This calculation has not been validated in all clinical      Latest Ref Rng & Units 07/19/2024    8:51 AM 07/10/2024    9:55 AM 07/12/2007   11:00 AM  Hepatic Function  Total Protein 6.0 - 8.3 g/dL 7.1  6.7  6.8   Albumin 3.5 - 5.2 g/dL 4.6   4.1   AST 0 - 37 U/L 25   23  25    ALT 0 - 53 U/L 25  21  28    Alk Phosphatase 39 - 117 U/L 81   75   Total Bilirubin 0.2 - 1.2 mg/dL 0.9  1.0  1.1       Current Medications:   Current Outpatient Medications (Cardiovascular):    rosuvastatin  (CRESTOR ) 20 MG tablet, Take 1 tablet (20 mg total) by mouth daily.   sildenafil (VIAGRA) 50 MG tablet, 1 tablet as needed, as directed, please use goodrx Orally Once a day for 30 day(s) (Patient taking differently: as needed.)  Current Outpatient Medications (Respiratory):    azelastine  (ASTELIN ) 0.1 % nasal spray, Place 2 sprays into both nostrils 2 (two) times daily. Use in each nostril as directed   loratadine  (CLARITIN ) 10 MG tablet, Take 1 tablet (10 mg total) by mouth daily.  Current Outpatient Medications (Other):    Cholecalciferol (VITAMIN D -3 PO), Take 1,000 Units by mouth daily.   famotidine  (PEPCID ) 40 MG tablet, Take 1 tablet (40 mg total) by mouth at bedtime.   pantoprazole (PROTONIX) 40 MG tablet, Take 40 mg by mouth daily. (Patient taking differently: Take 40 mg by mouth as needed.)  Medical History:  Past Medical History:  Diagnosis Date   Allergy    seasonal   Aortic atherosclerosis 08/07/2024   CAD in native artery 08/07/2024   Chronic cough 08/07/2024   Heart murmur    Hyperlipidemia    pure hypercholesterolemia 08/07/2024   Allergies: Allergies[1]   Surgical History:  He  has a past surgical history that includes Spine surgery; Colonoscopy; and Hand surgery (Left). Family History:  His family history includes Cancer in his brother; Diabetes in his brother; Heart attack in his maternal grandfather and paternal grandfather; Heart disease (age of onset: 21) in his father; Stroke in his cousin.  REVIEW OF SYSTEMS  : All other systems reviewed and negative except where noted in the History of Present Illness.  PHYSICAL EXAM: BP 124/60   Pulse 82   Ht 5' 11 (1.803 m)   Wt 153 lb 2 oz (69.5 kg)   BMI 21.36 kg/m  Physical Exam   GENERAL  APPEARANCE: Well nourished, in no apparent distress HEENT: No cervical lymphadenopathy, unremarkable thyroid , sclerae anicteric, conjunctiva pink RESPIRATORY: Respiratory effort normal, BS equal bilateral without rales, rhonchi, wheezing CARDIO: RRR with no MRGs, peripheral pulses intact ABDOMEN: Soft, non distended, active bowel sounds in all 4 quadrants, no tenderness to palpation, no rebound, no mass appreciated RECTAL: declines MUSCULOSKELETAL: Full ROM, normal gait, without edema SKIN: Dry, intact without rashes or lesions. No jaundice. NEURO: Alert, oriented, no focal deficits PSYCH: Cooperative, normal mood and affect.      Alan JONELLE Coombs, PA-C 3:27 PM      [1]  Allergies Allergen Reactions   Erythromycin Other (See Comments)   Aspirin     REACTION: hives   Azithromycin Other (See Comments)   Penicillins     REACTION: hives   Erythromycin Base Other (See Comments) and Nausea Only   "

## 2024-08-29 ENCOUNTER — Encounter: Payer: Self-pay | Admitting: Physician Assistant

## 2024-08-29 ENCOUNTER — Ambulatory Visit: Admitting: Physician Assistant

## 2024-08-29 VITALS — BP 124/60 | HR 82 | Ht 71.0 in | Wt 153.1 lb

## 2024-08-29 DIAGNOSIS — R0989 Other specified symptoms and signs involving the circulatory and respiratory systems: Secondary | ICD-10-CM | POA: Diagnosis not present

## 2024-08-29 DIAGNOSIS — K219 Gastro-esophageal reflux disease without esophagitis: Secondary | ICD-10-CM

## 2024-08-29 DIAGNOSIS — Z860101 Personal history of adenomatous and serrated colon polyps: Secondary | ICD-10-CM

## 2024-08-29 MED ORDER — FAMOTIDINE 40 MG PO TABS: 40.0000 mg | ORAL_TABLET | Freq: Every day | ORAL | 1 refills | Status: AC

## 2024-08-29 MED ORDER — AZELASTINE HCL 0.1 % NA SOLN: 2.0000 | Freq: Two times a day (BID) | NASAL | 0 refills | Status: AC

## 2024-08-29 NOTE — Patient Instructions (Addendum)
 " VISIT SUMMARY:  Today, we discussed your chronic throat clearing and mild gastroesophageal reflux disease (GERD). Your symptoms are well-controlled, and there are no alarm symptoms or complications.  YOUR PLAN:  GASTROESOPHAGEAL REFLUX DISEASE (GERD): You have chronic, mild GERD with rare heartburn and habitual throat clearing, but no serious symptoms or complications. -Try Reflux Gourmet samples for one month AFTER food, with pepcid  40 mg for one month.  -Use Astelin  nasal spray for post-nasal drip for 1 month if the above does not help, with the option to use it with or instead of Claritin . -Use sugar-free candies and practice voice rest for throat irritation. -Complete the H. pylori stool antigen test. -Endoscopy is not needed at this time as there are no alarm symptoms.  ESOPHAGEAL DYSMOTILITY: You have mild esophageal dysmotility likely due to chronic reflux, without difficulty swallowing or significant obstruction. -No specific treatment is needed at this time.  Silent reflux: Not all heartburn burns...SABRASABRASABRA  What is LPR? Laryngopharyngeal reflux (LPR) or silent reflux is a condition in which acid that is made in the stomach travels up the esophagus (swallowing tube) and gets to the throat. Not everyone with reflux has a lot of heartburn or indigestion. In fact, many people with LPR never have heartburn. This is why LPR is called SILENT REFLUX, and the terms Silent reflux and LPR are often used interchangeably. Because LPR is silent, it is sometimes difficult to diagnose.  How can you tell if you have LPR?  Chronic hoarseness- Some people have hoarseness that comes and goes throat clearing  Cough It can cause shortness of breath and cause asthma like symptoms. a feeling of a lump in the throat  difficulty swallowing a problem with too much nose and throat drainage.  Some people will feel their esophagus spasm which feels like their heart beating hard and fast, this will  usually be after a meal, at rest, or lying down at night.    How do I treat this? Treatment for LPR should be individualized, and your doctor will suggest the best treatment for you. Generally there are several treatments for LPR: changing habits and diet to reduce reflux,  medications to reduce stomach acid, and  surgery to prevent reflux. Most people with LPR need to modify how and when they eat, as well as take some medication, to get well. Sometimes, nonprescription liquid antacids, such as Maalox, Gelucil and Mylanta are recommended. When used, these antacids should be taken four times each day - one tablespoon one hour after each meal and before bedtime. Dietary and lifestyle changes alone are not often enough to control LPR - medications that reduce stomach acid are also usually needed. These must be prescribed by our doctor.   TIPS FOR REDUCING REFLUX AND LPR Control your LIFE-STYLE and your DIET! If you use tobacco, QUIT.  Smoking makes you reflux. After every cigarette you have some LPR.  Don't wear clothing that is too tight, especially around the waist (trousers, corsets, belts).  Do not lie down just after eating...in fact, do not eat within three hours of bedtime.  You should be on a low-fat diet.  Limit your intake of red meat.  Limit your intake of butter.  Avoid fried foods.  Avoid chocolate  Avoid cheese.  Avoid eggs. Specifically avoid caffeine (especially coffee and tea), soda pop (especially cola) and mints.  Avoid alcoholic beverages, particularly in the evening.  Generally a cough is either coming from above or from below- so we will treat  this OR it can be from irritation/viral cough  To treat the nasal drip: Get on the chlorphenirmine every 6 hours- This medication can make you sleepy but helps with nasal drip- get from over the counter.   Can do a steroid nasal spary 1-2 sparys at night each nostril. Remember to spray each nostril twice towards the outer  part of your eye.  Do not sniff but instead pinch your nose and tilt your head back to help the medicine get into your sinuses.  The best time to do this is at bedtime. Stop if you get blurred vision or nose bleeds.   To treat the reflux Will send in prilosec 40 mg to take once in the morning and take prevacid from over the counter at night for 2 weeks- then stop the prilosec and continue the pravacid or famotadine  To stop irritation: Need to STOP the cough Do sugar free candy Do the tessalon drops VOICE REST is VERY important  Go to the ER or call the office if you get any chest pain, shortness of breath, severe  headache, leg swelling.    Common causes of cough OR hoarseness OR sore throat:   Allergies, Viral Infections, Acid Reflux and Bacterial Infections.    Allergies and viral infections cause a cough OR sore throat by post nasal drip and are often worse at night, can also have sneezing, lower grade fevers, clear/yellow mucus. This is best treated with allergy medications or nasal sprays.  Please get on allegra for 1-2 weeks The strongest is allegra or fexafinadine  Cheapest at [employer], [employer], [employer]   Bacterial infections are more severe than allergies or viral infections with fever, teeth pain, fatigue. This can be treated with prednisone and the same over the counter medication and after 7 days can be treated with an antibiotic.   Silent reflux/GERD can cause a cough OR sore throat OR hoarseness WITHOUT heart burn because the esophagus that goes to the stomach and trachea that goes to the lungs are very close and when you lay down the acid can irritate your throat and lungs. This can cause hoarseness, cough, and wheezing. Please stop any alcohol  or anti-inflammatories like aleve/advil/ibuprofen and start an over the counter Prilosec or omeprazole 1-2 times daily before food for 2 weeks, then switch to over the counter zantac/ratinidine or pepcid /famotadine once at night for 2  weeks.    sometimes irritation causes more irritation. Try voice rest, use sugar free cough drops to prevent coughing, and try to stop clearing your throat.   If you ever have a cough that does not go away after trying these things please make a follow up visit for further evaluation or we can refer you to a specialist. Or if you ever have shortness of breath or chest pain go to the ER.   "

## 2024-09-04 ENCOUNTER — Ambulatory Visit (HOSPITAL_BASED_OUTPATIENT_CLINIC_OR_DEPARTMENT_OTHER)
Admission: RE | Admit: 2024-09-04 | Discharge: 2024-09-04 | Disposition: A | Discharge status: Home / Self Care | Source: Ambulatory Visit | Attending: Internal Medicine | Admitting: Internal Medicine

## 2024-09-04 DIAGNOSIS — E210 Primary hyperparathyroidism: Secondary | ICD-10-CM | POA: Diagnosis present

## 2024-10-12 ENCOUNTER — Ambulatory Visit
